# Patient Record
Sex: Female | Born: 1965 | Race: Black or African American | Hispanic: No | Marital: Married | Smoking: Never smoker
Health system: Southern US, Community
[De-identification: ages and names within clinical notes are randomized; demographics above are authoritative.]

## PROBLEM LIST (undated history)

## (undated) DIAGNOSIS — E669 Obesity, unspecified: Secondary | ICD-10-CM

## (undated) DIAGNOSIS — E119 Type 2 diabetes mellitus without complications: Secondary | ICD-10-CM

## (undated) DIAGNOSIS — E7800 Pure hypercholesterolemia, unspecified: Secondary | ICD-10-CM

## (undated) DIAGNOSIS — R600 Localized edema: Secondary | ICD-10-CM

## (undated) DIAGNOSIS — D509 Iron deficiency anemia, unspecified: Secondary | ICD-10-CM

## (undated) DIAGNOSIS — K2990 Gastroduodenitis, unspecified, without bleeding: Secondary | ICD-10-CM

## (undated) DIAGNOSIS — K219 Gastro-esophageal reflux disease without esophagitis: Secondary | ICD-10-CM

## (undated) DIAGNOSIS — R0600 Dyspnea, unspecified: Secondary | ICD-10-CM

## (undated) DIAGNOSIS — M25552 Pain in left hip: Secondary | ICD-10-CM

## (undated) DIAGNOSIS — M549 Dorsalgia, unspecified: Secondary | ICD-10-CM

## (undated) DIAGNOSIS — E785 Hyperlipidemia, unspecified: Secondary | ICD-10-CM

## (undated) DIAGNOSIS — E049 Nontoxic goiter, unspecified: Secondary | ICD-10-CM

## (undated) HISTORY — DX: Pure hypercholesterolemia, unspecified: E78.00

## (undated) HISTORY — DX: Dorsalgia, unspecified: M54.9

## (undated) HISTORY — DX: Gastroduodenitis, unspecified, without bleeding: K29.90

## (undated) HISTORY — DX: Nontoxic goiter, unspecified: E04.9

## (undated) HISTORY — PX: TUBAL LIGATION: SHX77

## (undated) HISTORY — DX: Gastro-esophageal reflux disease without esophagitis: K21.9

## (undated) HISTORY — DX: Dyspnea, unspecified: R06.00

## (undated) HISTORY — DX: Iron deficiency anemia, unspecified: D50.9

## (undated) HISTORY — DX: Obesity, unspecified: E66.9

## (undated) HISTORY — DX: Localized edema: R60.0

## (undated) HISTORY — DX: Pain in left hip: M25.552

## (undated) HISTORY — PX: ABDOMINAL HYSTERECTOMY: SHX81

## (undated) HISTORY — DX: Hyperlipidemia, unspecified: E78.5

---

## 1999-07-11 ENCOUNTER — Emergency Department (HOSPITAL_COMMUNITY): Admission: EM | Admit: 1999-07-11 | Discharge: 1999-07-11 | Payer: Self-pay | Admitting: *Deleted

## 1999-09-21 ENCOUNTER — Inpatient Hospital Stay (HOSPITAL_COMMUNITY): Admission: AD | Admit: 1999-09-21 | Discharge: 1999-09-21 | Payer: Self-pay | Admitting: Obstetrics

## 2000-10-05 ENCOUNTER — Encounter: Payer: Self-pay | Admitting: Family Medicine

## 2000-10-05 ENCOUNTER — Ambulatory Visit (HOSPITAL_COMMUNITY): Admission: RE | Admit: 2000-10-05 | Discharge: 2000-10-05 | Payer: Self-pay | Admitting: Family Medicine

## 2000-11-19 ENCOUNTER — Emergency Department (HOSPITAL_COMMUNITY): Admission: EM | Admit: 2000-11-19 | Discharge: 2000-11-19 | Payer: Self-pay

## 2001-03-22 ENCOUNTER — Inpatient Hospital Stay (HOSPITAL_COMMUNITY): Admission: AD | Admit: 2001-03-22 | Discharge: 2001-03-22 | Payer: Self-pay | Admitting: Obstetrics

## 2001-04-18 ENCOUNTER — Emergency Department (HOSPITAL_COMMUNITY): Admission: EM | Admit: 2001-04-18 | Discharge: 2001-04-18 | Payer: Self-pay | Admitting: Emergency Medicine

## 2001-04-18 ENCOUNTER — Encounter: Payer: Self-pay | Admitting: Emergency Medicine

## 2001-07-23 ENCOUNTER — Emergency Department (HOSPITAL_COMMUNITY): Admission: EM | Admit: 2001-07-23 | Discharge: 2001-07-23 | Payer: Self-pay | Admitting: Emergency Medicine

## 2004-09-07 ENCOUNTER — Other Ambulatory Visit: Admission: RE | Admit: 2004-09-07 | Discharge: 2004-09-07 | Payer: Self-pay | Admitting: Obstetrics and Gynecology

## 2005-04-05 ENCOUNTER — Emergency Department (HOSPITAL_COMMUNITY): Admission: EM | Admit: 2005-04-05 | Discharge: 2005-04-05 | Payer: Self-pay | Admitting: Family Medicine

## 2005-07-15 ENCOUNTER — Emergency Department (HOSPITAL_COMMUNITY): Admission: EM | Admit: 2005-07-15 | Discharge: 2005-07-15 | Payer: Self-pay | Admitting: Family Medicine

## 2005-11-25 ENCOUNTER — Other Ambulatory Visit: Admission: RE | Admit: 2005-11-25 | Discharge: 2005-11-25 | Payer: Self-pay | Admitting: Obstetrics and Gynecology

## 2008-02-23 ENCOUNTER — Ambulatory Visit (HOSPITAL_BASED_OUTPATIENT_CLINIC_OR_DEPARTMENT_OTHER): Admission: RE | Admit: 2008-02-23 | Discharge: 2008-02-23 | Payer: Self-pay | Admitting: Urology

## 2008-07-18 ENCOUNTER — Emergency Department (HOSPITAL_COMMUNITY): Admission: EM | Admit: 2008-07-18 | Discharge: 2008-07-18 | Payer: Self-pay | Admitting: Family Medicine

## 2008-11-14 ENCOUNTER — Encounter: Admission: RE | Admit: 2008-11-14 | Discharge: 2008-11-14 | Payer: Self-pay | Admitting: Internal Medicine

## 2009-04-25 ENCOUNTER — Emergency Department (HOSPITAL_COMMUNITY): Admission: EM | Admit: 2009-04-25 | Discharge: 2009-04-25 | Payer: Self-pay | Admitting: Emergency Medicine

## 2009-05-20 ENCOUNTER — Ambulatory Visit (HOSPITAL_COMMUNITY): Admission: RE | Admit: 2009-05-20 | Discharge: 2009-05-20 | Payer: Self-pay | Admitting: Obstetrics & Gynecology

## 2009-10-31 ENCOUNTER — Encounter (INDEPENDENT_AMBULATORY_CARE_PROVIDER_SITE_OTHER): Payer: Self-pay | Admitting: Obstetrics & Gynecology

## 2009-10-31 ENCOUNTER — Observation Stay (HOSPITAL_COMMUNITY): Admission: RE | Admit: 2009-10-31 | Discharge: 2009-11-01 | Payer: Self-pay | Admitting: Obstetrics & Gynecology

## 2009-10-31 HISTORY — PX: ABDOMINAL HYSTERECTOMY: SHX81

## 2010-01-30 ENCOUNTER — Encounter: Admission: RE | Admit: 2010-01-30 | Discharge: 2010-01-30 | Payer: Self-pay | Admitting: Family Medicine

## 2010-07-03 ENCOUNTER — Inpatient Hospital Stay (INDEPENDENT_AMBULATORY_CARE_PROVIDER_SITE_OTHER)
Admission: RE | Admit: 2010-07-03 | Discharge: 2010-07-03 | Disposition: A | Discharge status: Home / Self Care | Payer: BC Managed Care – PPO | Source: Ambulatory Visit | Attending: Family Medicine | Admitting: Family Medicine

## 2010-07-03 DIAGNOSIS — S335XXA Sprain of ligaments of lumbar spine, initial encounter: Secondary | ICD-10-CM

## 2010-07-19 LAB — CBC
HCT: 30.9 % — ABNORMAL LOW (ref 36.0–46.0)
Hemoglobin: 10.1 g/dL — ABNORMAL LOW (ref 12.0–15.0)
MCH: 25.9 pg — ABNORMAL LOW (ref 26.0–34.0)
MCHC: 32.6 g/dL (ref 30.0–36.0)
MCV: 79.5 fL (ref 78.0–100.0)
Platelets: 281 10*3/uL (ref 150–400)
RBC: 3.88 MIL/uL (ref 3.87–5.11)
RDW: 17.1 % — ABNORMAL HIGH (ref 11.5–15.5)
WBC: 11.5 10*3/uL — ABNORMAL HIGH (ref 4.0–10.5)

## 2010-07-19 LAB — TYPE AND SCREEN
ABO/RH(D): A POS
Antibody Screen: NEGATIVE

## 2010-07-19 LAB — ABO/RH: ABO/RH(D): A POS

## 2010-07-20 LAB — CBC
HCT: 33.1 % — ABNORMAL LOW (ref 36.0–46.0)
HCT: 35.1 % — ABNORMAL LOW (ref 36.0–46.0)
Hemoglobin: 10.7 g/dL — ABNORMAL LOW (ref 12.0–15.0)
Hemoglobin: 11.2 g/dL — ABNORMAL LOW (ref 12.0–15.0)
MCH: 25.6 pg — ABNORMAL LOW (ref 26.0–34.0)
MCHC: 31.9 g/dL (ref 30.0–36.0)
MCHC: 32.5 g/dL (ref 30.0–36.0)
MCV: 78.9 fL (ref 78.0–100.0)
MCV: 79.7 fL (ref 78.0–100.0)
Platelets: 273 10*3/uL (ref 150–400)
Platelets: 290 10*3/uL (ref 150–400)
RBC: 4.19 MIL/uL (ref 3.87–5.11)
RBC: 4.41 MIL/uL (ref 3.87–5.11)
RDW: 17.1 % — ABNORMAL HIGH (ref 11.5–15.5)
RDW: 18 % — ABNORMAL HIGH (ref 11.5–15.5)
WBC: 5.2 10*3/uL (ref 4.0–10.5)
WBC: 5.6 10*3/uL (ref 4.0–10.5)

## 2010-07-20 LAB — SURGICAL PCR SCREEN
MRSA, PCR: NEGATIVE
Staphylococcus aureus: POSITIVE — AB

## 2010-07-20 LAB — PREGNANCY, URINE
Preg Test, Ur: NEGATIVE
Preg Test, Ur: NEGATIVE

## 2010-08-13 LAB — POCT RAPID STREP A (OFFICE): Streptococcus, Group A Screen (Direct): NEGATIVE

## 2010-08-26 ENCOUNTER — Other Ambulatory Visit: Payer: Self-pay | Admitting: Family Medicine

## 2010-08-26 DIAGNOSIS — E041 Nontoxic single thyroid nodule: Secondary | ICD-10-CM

## 2010-08-28 ENCOUNTER — Ambulatory Visit
Admission: RE | Admit: 2010-08-28 | Discharge: 2010-08-28 | Disposition: A | Discharge status: Home / Self Care | Payer: BC Managed Care – PPO | Source: Ambulatory Visit | Attending: Family Medicine | Admitting: Family Medicine

## 2010-08-28 DIAGNOSIS — E041 Nontoxic single thyroid nodule: Secondary | ICD-10-CM

## 2010-09-15 NOTE — Op Note (Signed)
NAME:  Leah Gonzalez, Leah Gonzalez             ACCOUNT NO.:  [id number]   MEDICAL RECORD NO.:  [id number]          PATIENT TYPE:  AMB   LOCATION:  [facility]                         FACILITY:  [facility]   PHYSICIAN:  Lindaann Slough, M.D.  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   PROCEDURE:  Cystoscopy, meatal dilation, HOB.   SURGEON:  Danae Chen, M.D.   ANESTHESIA:  General.   INDICATIONS:  The patient is a 45 year old female who has been  complaining of pelvic pain, frequency, urgency, decreased force of  urinary stream.  She was treated with terazosin and oxybutynin and she  has continued to have the same symptoms.  She could not tolerate  urethral catheterization.  She is scheduled today for cystoscopy, HOD.   The procedure risks and benefits were discussed with the patient.  The  risks include but are not limited to hemorrhage, pelvic pain, dysuria,  bladder rupture.  She understands and would like to proceed.   PROCEDURE IN DETAIL:  The patient was identified by her wrist band and a  proper time-out was taken.  Under general anesthesia she was prepped and  draped and placed in the dorsal lithotomy position.  A panendoscope  could not be inserted in the bladder because of meatal stenosis.  The  urethra was then dilated up to #32 [place] then the panendoscope was  inserted without difficulty in the bladder.  There was no stone or tumor  in the bladder.  The ureteral orifices were in normal position and shape  with clear efflux.  Upon refilling of the bladder there was evidence of  submucosal hemorrhage.  The bladder was then distended for about 10  minutes and the bladder capacity was about 400 mL.  The bladder was  then  emptied and then the cystoscope removed.  Then 400 mg of Pyridium in 20  mL of 0.25% Marcaine were instilled in the bladder using #22 [place]  sounds.  The patient tolerated the procedure well and left the OR in  satisfactory condition to post anesthesia care unit.      Lindaann Slough, M.D.  Electronically Signed     MN/MEDQ  D:  02/23/2008  T:  02/23/2008  Job:  [id number]

## 2011-02-02 LAB — POCT PREGNANCY, URINE: Preg Test, Ur: NEGATIVE

## 2011-02-02 LAB — POCT HEMOGLOBIN-HEMACUE: Hemoglobin: 10.3 — ABNORMAL LOW

## 2011-02-19 ENCOUNTER — Other Ambulatory Visit: Payer: Self-pay | Admitting: Family Medicine

## 2011-02-19 DIAGNOSIS — E041 Nontoxic single thyroid nodule: Secondary | ICD-10-CM

## 2011-02-22 ENCOUNTER — Ambulatory Visit
Admission: RE | Admit: 2011-02-22 | Discharge: 2011-02-22 | Disposition: A | Discharge status: Home / Self Care | Payer: BC Managed Care – PPO | Source: Ambulatory Visit | Attending: Family Medicine | Admitting: Family Medicine

## 2011-02-22 DIAGNOSIS — E041 Nontoxic single thyroid nodule: Secondary | ICD-10-CM

## 2011-02-25 ENCOUNTER — Other Ambulatory Visit: Payer: Self-pay | Admitting: Family Medicine

## 2011-02-25 DIAGNOSIS — E041 Nontoxic single thyroid nodule: Secondary | ICD-10-CM

## 2011-03-08 ENCOUNTER — Other Ambulatory Visit: Payer: Self-pay | Admitting: Physician Assistant

## 2011-03-08 DIAGNOSIS — E041 Nontoxic single thyroid nodule: Secondary | ICD-10-CM

## 2011-03-10 ENCOUNTER — Other Ambulatory Visit (HOSPITAL_COMMUNITY)
Admission: RE | Admit: 2011-03-10 | Discharge: 2011-03-10 | Disposition: A | Discharge status: Home / Self Care | Payer: BC Managed Care – PPO | Source: Ambulatory Visit | Attending: Diagnostic Radiology | Admitting: Diagnostic Radiology

## 2011-03-10 ENCOUNTER — Ambulatory Visit
Admission: RE | Admit: 2011-03-10 | Discharge: 2011-03-10 | Disposition: A | Discharge status: Home / Self Care | Payer: BC Managed Care – PPO | Source: Ambulatory Visit | Attending: Family Medicine | Admitting: Family Medicine

## 2011-03-10 DIAGNOSIS — E049 Nontoxic goiter, unspecified: Secondary | ICD-10-CM | POA: Insufficient documentation

## 2012-01-26 ENCOUNTER — Encounter: Payer: Self-pay | Admitting: *Deleted

## 2012-01-26 ENCOUNTER — Encounter: Payer: BC Managed Care – PPO | Attending: Obstetrics & Gynecology | Admitting: *Deleted

## 2012-01-26 VITALS — Ht 60.0 in | Wt 203.3 lb

## 2012-01-26 DIAGNOSIS — E669 Obesity, unspecified: Secondary | ICD-10-CM | POA: Insufficient documentation

## 2012-01-26 DIAGNOSIS — Z713 Dietary counseling and surveillance: Secondary | ICD-10-CM | POA: Insufficient documentation

## 2012-01-26 NOTE — Patient Instructions (Addendum)
For anemia: choose more lean cut of red mets: sirloin, tenderloin, filet, instead of ribeye.  Look for cut that has least amount of marbling (fat) Choose 90/10 ground beef or ground [place] Choose red met 1-2 times/week; dried bean (pinto, kidney, northern, black eyed peas) 2-3 times a week Choose leafy green vegetables (spinach, turnip green, collard greens etc) 2-3 times Special K or other fortified cereal most days for breakfast Try orange or tangerine or clementine or any citrus fruit  Goals:  Eat 3 meals/day, Avoid meal skipping   Increase protein rich foods  Follow "Plate Method" for portion control  Limit carbohydrate1-2 servings/meal   Choose more whole grains, lean protein, low-fat dairy, and fruits/non-starchy vegetables.   Aim for increased physical activity (walk maybe 2 days/week) take stairs, park farther away, etc  Continue Limit sugar-sweetened beverages and concentrated sweets  Choose Smart Balance vegetable spread or Promise Spread Low fat popcorn, mini bag size Choose lean cuts of meat and low fat cooking methods (bake, broil, grill, NOT fry) Use olive oil or vegetable oil for cooking Choose vinaigrette dressing instead of creamy dressing more often   Guilt-free snack options: Pretzels, low fat popcorn, baked chips, sweet options from handout

## 2012-01-26 NOTE — Progress Notes (Signed)
  Medical Nutrition Therapy:  Appt start time: 1430 end time:  1530.   Assessment:  Primary concerns today: obesity. Also reveals she suffers from anemia  MEDICATIONS: none   DIETARY INTAKE:  Usual eating pattern includes 2-3 meals and 1-2 snacks per day.  Everyday foods include starches, fatty proteins, some fruits and vegetables.  Avoided foods include none.    24-hr recall:  B ( AM): hardee's harshbrowns; special k; may skip Snk ( AM): yogurt  L ( PM): chicken nuggets and mashed potatoes and applesauce; salad Snk ( PM): popcorns, cookie D ( PM): 2 bowls broccoli soup with chicken and onion; fried talapia with mashed potatoes and northern beans Snk ( PM): none Beverages: water or diet soda (tries to limit now); splenda in tea; 2% milk in cereal  Usual physical activity: sometimes walks  Estimated energy needs: 1400 calories 158 g carbohydrates 105 g protein 39 g fat  Progress Towards Goal(s):  In progress.   Nutritional Diagnosis:  [place]-3.3 Overweight/obesity As related to irregular meal pattern, fatty food choices, and limited physical activity.  As evidenced by BMI of 39.7.    Intervention:  Nutrition counseling provided.Leah Gonzalez is here for weight loss education.  She reports not knowing anything about healthy eating.  She typically skips breakfast, has a high fat lunch, and may have balanced dinner or may have popcorn or cereal for dinner.  Currently has no physical activity.  Discussed MyPlate for meal planning: complex carbohydrates; lean proteins; non-starchy vegetables.  Suggested using fruits as snacks.  Discussed low-fat food preparation methods, increasing fiber, and increasing iron-rich foods for her anemia.  Discussed healthy snack ideas and encouraged her to eat 3 meals a day.  Also suggested increasing physical activity.  Handouts given during visit include:  Reading food labels  Guilt-free sweets  My pocket portion guide  Monitoring/Evaluation:  Dietary  intake, exercise, and body weight in 1 month(s).

## 2012-02-14 ENCOUNTER — Telehealth: Payer: Self-pay | Admitting: *Deleted

## 2012-02-28 ENCOUNTER — Encounter: Payer: BC Managed Care – PPO | Attending: Obstetrics & Gynecology | Admitting: *Deleted

## 2012-02-28 VITALS — Ht 60.0 in | Wt 197.8 lb

## 2012-02-28 DIAGNOSIS — Z713 Dietary counseling and surveillance: Secondary | ICD-10-CM | POA: Insufficient documentation

## 2012-02-28 DIAGNOSIS — E669 Obesity, unspecified: Secondary | ICD-10-CM | POA: Insufficient documentation

## 2012-02-28 DIAGNOSIS — R635 Abnormal weight gain: Secondary | ICD-10-CM

## 2012-02-28 NOTE — Progress Notes (Signed)
  Medical Nutrition Therapy:  Appt start time: 1630 end time:  1700.   Assessment:  Primary concerns today: obesity follow up.   MEDICATIONS: see list   DIETARY INTAKE:  Usual eating pattern includes 3 meals and 1-2 snacks per day.  Everyday foods include lean meats, fruits, vegetables, some starches.  Avoided foods include excessive fats.    24-hr recall:  B ( AM): tex[place] toast, ham, and cheese or cereal with milk Snk ( AM): none usually  L ( PM): grilled cheese and applesauce Snk ( PM): small portion pringles D ( PM): baked, grilled meat with vegetables and starch Snk ( PM): non usually Beverages: water  Usual physical activity: one time a week  Estimated energy needs: 1400 calories 158 g carbohydrates 105 g protein 39 g fat  Progress Towards Goal(s):  Some progress.   Nutritional Diagnosis:  [place]-3.3 Overweight/obesity As related to irregular meal pattern, fatty food choices, and limited physical activity. As evidenced by BMI of 38.6      Intervention:  Nutrition counseling provided.  Leah Gonzalez made many lifestyle changes and has lost about 6 pounds since her last visit!  She has cut back her portions, cut out fried foods (she only bakes, broils, or grills now)  She has limited her fast food intake, cut out sodas, drinks more water, has increased her fruits and vegetables consumption, she limits her starches, follow MyPlate recommendations, and limits her fat to 10 g/serving.  She has followed virtually all dietary recommendations related to weight management.  She also increased iron-rich foods like spinach and lean red meats like sirloin.  She eats more dried beans and fortified cereals.  She has not increased her physically activity.  She likes to walk on her treadmill for about an hour, but hasn't done that much.  Suggested trying to walk for 20 minutes, instead of an hour. Try to walk 20 minutes 3-4 times a week.  Leah Gonzalez has imposed some pretty strict dietary rules on herself:  no bread AND potatoes in the same meal; no pasta, etc.  Encouraged her to reject black and white thinking and to relax those rules.  Taught her to honor her hunger and satiety cues and to eat pasta if that's what she wants, but to stop when full (intuitive eating).  Suggested rating hunger and satiety on a scale.   Monitoring/Evaluation:  Dietary intake, exercise, and body weight in 3 month(s).

## 2012-05-30 ENCOUNTER — Ambulatory Visit: Payer: BC Managed Care – PPO | Admitting: *Deleted

## 2012-06-11 ENCOUNTER — Emergency Department (HOSPITAL_COMMUNITY)
Admission: EM | Admit: 2012-06-11 | Discharge: 2012-06-11 | Disposition: A | Discharge status: Home / Self Care | Payer: BC Managed Care – PPO | Source: Home / Self Care

## 2012-06-11 ENCOUNTER — Encounter (HOSPITAL_COMMUNITY): Payer: Self-pay | Admitting: *Deleted

## 2012-06-11 ENCOUNTER — Emergency Department (INDEPENDENT_AMBULATORY_CARE_PROVIDER_SITE_OTHER): Payer: BC Managed Care – PPO

## 2012-06-11 DIAGNOSIS — M25569 Pain in unspecified knee: Secondary | ICD-10-CM

## 2012-06-11 MED ORDER — HYDROCODONE-ACETAMINOPHEN 5-325 MG PO TABS: 1.0000 | ORAL_TABLET | Freq: Four times a day (QID) | ORAL | Status: DC | PRN
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
## 2012-06-13 NOTE — ED Provider Notes (Signed)
Medical screening examination/treatment/procedure(s) were performed by resident physician or non-physician practitioner and as supervising physician I was immediately available for consultation/collaboration.   Lauralyn Shadowens DOUGLAS MD.   Benjimen Kelley D Nakeya Adinolfi, MD 06/13/12 1109 

## 2012-07-05 ENCOUNTER — Encounter: Payer: BC Managed Care – PPO | Attending: Family Medicine | Admitting: *Deleted

## 2012-07-05 VITALS — Ht 60.0 in | Wt 202.4 lb

## 2012-07-05 DIAGNOSIS — E669 Obesity, unspecified: Secondary | ICD-10-CM | POA: Insufficient documentation

## 2012-07-05 DIAGNOSIS — Z713 Dietary counseling and surveillance: Secondary | ICD-10-CM | POA: Insufficient documentation

## 2012-07-05 NOTE — Progress Notes (Signed)
  Medical Nutrition Therapy:  Appt start time: 1600 end time:  1630.   Assessment:  Primary concerns today: Leah Gonzalez is here for a follow up appointment for obesity education.  She did gain about 4 pounds in 4 months and she is upset by this.  A dietary recall reveals high sodium content, which could contribute to water retention and thus excess weight.  She also has not been as physically active since she's out of work for her knee.     DIETARY INTAKE:  Usual eating pattern includes 3 meals and 2 snacks per day.  Everyday foods include processed foods.  Avoided foods include none.    24-hr recall: been out of work for 1 month B ( AM): 3 strips of bacon, egg beater sometimes with spinach and grits with butter with water  Snk ( AM): tangerines  Sometimes onion rings  L ( PM): oodles of noodles or grilled cheese and tomato soup;  Snk ( PM): key lime pie ice cream D ( PM): grilled cheese and tomato soup, spinach pizza with olives and chicken Snk ( PM): maybe orange Beverages: water  Usual physical activity: none currently  Estimated energy needs: 1600 calories 180 g carbohydrates 120 g protein 44 g fat  Progress Towards Goal(s):  In progress.   Nutritional Diagnosis:  [place]-3.3 Overweight/obesity As related to history of irregular meal pattern, fatty food choices, and limited physical activity. As evidenced by BMI>30     Intervention:  Nutrition counseling provided.  Discussed sodium content of foods and its effect on water retention.  Recommended 2300 mg sodium/day.  Suggested Detra start reading food labels and aim to keep sodium content low.  Since she is at home currently, she has more time to prepare meals.  Suggested making more foods from fresh ingredients at home, rather than consuming so much processed foods. Suggested limiting bacon and canned soups and oodles of noodles.  Encouraged body movement.  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  She also agreed that she could do upper body exercises while seated.  Handouts given during visit include:  Reading food labels  Monitoring/Evaluation:  Dietary intake, exercise, and body weight in 1 month(s).

## 2012-08-14 ENCOUNTER — Ambulatory Visit: Payer: BC Managed Care – PPO | Admitting: *Deleted

## 2012-11-24 ENCOUNTER — Emergency Department (HOSPITAL_COMMUNITY)
Admission: EM | Admit: 2012-11-24 | Discharge: 2012-11-24 | Disposition: A | Discharge status: Home / Self Care | Payer: BC Managed Care – PPO | Attending: Emergency Medicine | Admitting: Emergency Medicine

## 2012-11-24 ENCOUNTER — Encounter (HOSPITAL_COMMUNITY): Payer: Self-pay | Admitting: *Deleted

## 2012-11-24 DIAGNOSIS — S139XXA Sprain of joints and ligaments of unspecified parts of neck, initial encounter: Secondary | ICD-10-CM | POA: Insufficient documentation

## 2012-11-24 DIAGNOSIS — Y9389 Activity, other specified: Secondary | ICD-10-CM | POA: Insufficient documentation

## 2012-11-24 DIAGNOSIS — Z79899 Other long term (current) drug therapy: Secondary | ICD-10-CM | POA: Insufficient documentation

## 2012-11-24 DIAGNOSIS — S161XXA Strain of muscle, fascia and tendon at neck level, initial encounter: Secondary | ICD-10-CM

## 2012-11-24 DIAGNOSIS — E669 Obesity, unspecified: Secondary | ICD-10-CM | POA: Insufficient documentation

## 2012-11-24 DIAGNOSIS — Y92410 Unspecified street and highway as the place of occurrence of the external cause: Secondary | ICD-10-CM | POA: Insufficient documentation

## 2012-11-24 MED ORDER — CYCLOBENZAPRINE HCL 10 MG PO TABS: 10.0000 mg | ORAL_TABLET | Freq: Two times a day (BID) | ORAL | Status: DC | PRN

## 2012-11-24 MED ORDER — OXYCODONE-ACETAMINOPHEN 5-325 MG PO TABS: 1.0000 | ORAL_TABLET | ORAL | Status: DC | PRN

## 2012-11-24 MED ORDER — IBUPROFEN 800 MG PO TABS: 800.0000 mg | ORAL_TABLET | Freq: Three times a day (TID) | ORAL | Status: DC
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
## 2013-03-16 ENCOUNTER — Emergency Department (HOSPITAL_COMMUNITY)
Admission: EM | Admit: 2013-03-16 | Discharge: 2013-03-16 | Disposition: A | Discharge status: Home / Self Care | Payer: BC Managed Care – PPO | Attending: Emergency Medicine | Admitting: Emergency Medicine

## 2013-03-16 ENCOUNTER — Emergency Department (HOSPITAL_COMMUNITY): Payer: BC Managed Care – PPO

## 2013-03-16 DIAGNOSIS — R6883 Chills (without fever): Secondary | ICD-10-CM | POA: Insufficient documentation

## 2013-03-16 DIAGNOSIS — R05 Cough: Secondary | ICD-10-CM

## 2013-03-16 DIAGNOSIS — Z791 Long term (current) use of non-steroidal anti-inflammatories (NSAID): Secondary | ICD-10-CM | POA: Insufficient documentation

## 2013-03-16 DIAGNOSIS — R51 Headache: Secondary | ICD-10-CM | POA: Insufficient documentation

## 2013-03-16 DIAGNOSIS — R091 Pleurisy: Secondary | ICD-10-CM | POA: Insufficient documentation

## 2013-03-16 DIAGNOSIS — R059 Cough, unspecified: Secondary | ICD-10-CM

## 2013-03-16 DIAGNOSIS — Z79899 Other long term (current) drug therapy: Secondary | ICD-10-CM | POA: Insufficient documentation

## 2013-03-16 DIAGNOSIS — IMO0002 Reserved for concepts with insufficient information to code with codable children: Secondary | ICD-10-CM | POA: Insufficient documentation

## 2013-03-16 DIAGNOSIS — Z91040 Latex allergy status: Secondary | ICD-10-CM | POA: Insufficient documentation

## 2013-03-16 DIAGNOSIS — R0602 Shortness of breath: Secondary | ICD-10-CM | POA: Insufficient documentation

## 2013-03-16 LAB — BASIC METABOLIC PANEL
BUN: 18 mg/dL (ref 6–23)
CO2: 29 mEq/L (ref 19–32)
Calcium: 9.2 mg/dL (ref 8.4–10.5)
Chloride: 99 mEq/L (ref 96–112)
Creatinine, Ser: 0.68 mg/dL (ref 0.50–1.10)
GFR calc Af Amer: >90 mL/min (ref >90)
GFR calc non Af Amer: >90 mL/min (ref >90)
Glucose, Bld: 89 mg/dL (ref 70–99)
Potassium: 3.7 mEq/L (ref 3.5–5.1)
Sodium: 135 mEq/L (ref 135–145)

## 2013-03-16 LAB — CBC WITH DIFFERENTIAL/PLATELET
Basophils Absolute: 0 10*3/uL (ref 0.0–0.1)
Basophils Relative: 0 % (ref 0–1)
Eosinophils Absolute: 0.1 10*3/uL (ref 0.0–0.7)
Eosinophils Relative: 1 % (ref 0–5)
HCT: 36.1 % (ref 36.0–46.0)
Hemoglobin: 11.7 g/dL — ABNORMAL LOW (ref 12.0–15.0)
Lymphocytes Relative: 51 % — ABNORMAL HIGH (ref 12–46)
Lymphs Abs: 5.6 10*3/uL — ABNORMAL HIGH (ref 0.7–4.0)
MCH: 26.3 pg (ref 26.0–34.0)
MCHC: 32.4 g/dL (ref 30.0–36.0)
MCV: 81.1 fL (ref 78.0–100.0)
Monocytes Absolute: 0.7 10*3/uL (ref 0.1–1.0)
Monocytes Relative: 7 % (ref 3–12)
Neutro Abs: 4.6 10*3/uL (ref 1.7–7.7)
Neutrophils Relative %: 42 % — ABNORMAL LOW (ref 43–77)
Platelets: 314 10*3/uL (ref 150–400)
RBC: 4.45 MIL/uL (ref 3.87–5.11)
RDW: 14.8 % (ref 11.5–15.5)
WBC: 11 10*3/uL — ABNORMAL HIGH (ref 4.0–10.5)

## 2013-03-16 LAB — POCT I-STAT TROPONIN I: Troponin i, poc: 0.02 ng/mL (ref 0.00–0.08)

## 2013-03-16 LAB — PRO B NATRIURETIC PEPTIDE: Pro B Natriuretic peptide (BNP): 66.3 pg/mL (ref 0–125)

## 2013-03-16 LAB — D-DIMER, QUANTITATIVE: D-Dimer, Quant: 0.28 ug/mL-FEU (ref 0.00–0.48)

## 2013-03-16 MED ORDER — NAPROXEN 500 MG PO TABS: 500.0000 mg | ORAL_TABLET | Freq: Two times a day (BID) | ORAL | Status: DC

## 2013-03-16 MED ORDER — HYDROCODONE-ACETAMINOPHEN 5-325 MG PO TABS: 1.0000 | ORAL_TABLET | ORAL | Status: DC | PRN

## 2013-03-16 MED ORDER — SODIUM CHLORIDE 0.9 % IV SOLN
INTRAVENOUS | Status: DC
  Administered 2013-03-16: 18:00:00 via INTRAVENOUS

## 2013-03-16 NOTE — ED Notes (Signed)
MD at bedside. 

## 2013-03-16 NOTE — ED Provider Notes (Signed)
CSN: [id number]     Arrival date & time 03/16/13  1630 History  First MD Initiated Contact with Patient 03/16/13 1633     Chief Complaint  Patient presents with  . Cough  . Chest Pain   HPI Comments: Symptoms initially started out as a uri type illness.  She had nasal congestion and felt feverish.  Her doctor started her on abx empricially.  Symptoms have not improved.  She continues to have a peristent cough and has pain in the chest and shoulders with coughing.  She does feel short of breath.  No leg swelling.  No history of DVT or PE.  She saw her doctor today and was told to come to the ED for blood tests and xrays.  Patient is a 47 y.o. female presenting with cough and chest pain. The history is provided by the patient.  Cough Cough characteristics:  Non-productive Onset quality:  Gradual Duration:  3 weeks Progression since onset: not improving. Relieved by:  Nothing Worsened by:  Deep breathing Associated symptoms: chest pain, chills and headaches   Associated symptoms: no diaphoresis, no rash, no sore throat (her voice his hoarse) and no wheezing   Chest Pain Pain quality: sharp   Timing:  Intermittent Worsened by:  Deep breathing Associated symptoms: cough and headache   Associated symptoms: no diaphoresis     No past medical history on file. No past surgical history on file. No family history on file. History  Substance Use Topics  . Smoking status: Not on file  . Smokeless tobacco: Not on file  . Alcohol Use: Not on file   OB History   No data available     Review of Systems  Constitutional: Positive for chills. Negative for diaphoresis.  HENT: Negative for sore throat (her voice his hoarse).   Respiratory: Positive for cough. Negative for wheezing.   Cardiovascular: Positive for chest pain.  Skin: Negative for rash.  Neurological: Positive for headaches.  All other systems reviewed and are negative.    Allergies  Latex  Home Medications   Current  Outpatient Rx  Name  Route  Sig  Dispense  Refill  . docusate sodium (COLACE) 100 MG capsule   Oral   Take 100 mg by mouth 2 (two) times daily.         . Multiple Vitamin (MULTIVITAMIN WITH MINERALS) TABS tablet   Oral   Take 1 tablet by mouth daily.         . phentermine (ADIPEX-P) 37.5 MG tablet   Oral   Take 37.5 mg by mouth daily before breakfast.         . predniSONE (STERAPRED UNI-PAK) 10 MG tablet   Oral   Take 1-2 tablets by mouth 4 (four) times daily.         . benzonatate (TESSALON) 100 MG capsule   Oral   Take 100 mg by mouth 3 (three) times daily as needed for cough.         Marland Kitchen HYDROcodone-acetaminophen (NORCO/VICODIN) 5-325 MG per tablet   Oral   Take 1-2 tablets by mouth every 4 (four) hours as needed.   16 tablet   0   . naproxen (NAPROSYN) 500 MG tablet   Oral   Take 1 tablet (500 mg total) by mouth 2 (two) times daily.   30 tablet   0    BP 124/79  Pulse 84  Temp(Src) 98.9 F (37.2 C) (Oral)  Resp 16  SpO2 100% Physical Exam  Nursing note and vitals reviewed. Constitutional: She appears well-developed and well-nourished. No distress.  HENT:  Head: Normocephalic and atraumatic.  Right Ear: External ear normal.  Left Ear: External ear normal.  Eyes: Conjunctivae are normal. Right eye exhibits no discharge. Left eye exhibits no discharge. No scleral icterus.  Neck: Neck supple. No tracheal deviation present.  ? Crepitus supraclavicular region  Cardiovascular: Normal rate, regular rhythm and intact distal pulses.   Pulmonary/Chest: Effort normal and breath sounds normal. No stridor. No respiratory distress. She has no wheezes. She has no rales.  Abdominal: Soft. Bowel sounds are normal. She exhibits no distension. There is no tenderness. There is no rebound and no guarding.  Musculoskeletal: She exhibits no edema and no tenderness.  Neurological: She is alert. She has normal strength. No sensory deficit. Cranial nerve deficit:  no gross  defecits noted. She exhibits normal muscle tone. She displays no seizure activity. Coordination normal.  Skin: Skin is warm and dry. No rash noted.  Psychiatric: She has a normal mood and affect.    ED Course  Procedures (including critical care time) Labs Review Labs Reviewed  CBC WITH DIFFERENTIAL - Abnormal; Notable for the following:    WBC 11.0 (*)    Hemoglobin 11.7 (*)    Neutrophils Relative % 42 (*)    Lymphocytes Relative 51 (*)    Lymphs Abs 5.6 (*)    All other components within normal limits  BASIC METABOLIC PANEL  D-DIMER, QUANTITATIVE  PRO B NATRIURETIC PEPTIDE  POCT I-STAT TROPONIN I   Imaging Review Dg Chest 2 View  03/16/2013   CLINICAL DATA:  Cough.  Chest pain.  EXAM: CHEST  2 VIEW  COMPARISON:  None.  FINDINGS: The heart size and mediastinal contours are within normal limits. Both lungs are clear. The visualized skeletal structures are unremarkable.  IMPRESSION: No active cardiopulmonary disease.   Electronically Signed   By: Maisie Fus  Register   On: 03/16/2013 17:10    EKG Interpretation     Ventricular Rate:  80 PR Interval:  132 QRS Duration: 85 QT Interval:  364 QTC Calculation: 420 R Axis:   56 Text Interpretation:  Sinus rhythm Probable left atrial enlargement No old tracing to compare            MDM   1. Pleurisy   2. Cough    No pna, or ptx.  Doubt PE.  Possible pleurisy and persistent bronchitis.  At this time there does not appear to be any evidence of an acute emergency medical condition and the patient appears stable for discharge with appropriate outpatient follow up.     Celene Kras, MD 03/16/13 [phone]

## 2013-03-16 NOTE — ED Notes (Signed)
Pt c/o dry cough, fever, headache, soreness to shoulders and chest pain for the past 3 weeks. Pt has been to PMD for same. Pt has taken abx for her symptoms, but states she is not getting better. States her doctor wanted her to come to ED for further testing. Pt states she only has chest pain with deep breathing and coughing. Pt states chest pain is sharp and is in mid chest and L anterior chest. Pt reports that her voice is hoarse. States she has slight nausea. Pt with no acute distress. Skin warm, dry.

## 2013-11-22 ENCOUNTER — Other Ambulatory Visit: Payer: Self-pay | Admitting: Family Medicine

## 2013-11-22 ENCOUNTER — Ambulatory Visit
Admission: RE | Admit: 2013-11-22 | Discharge: 2013-11-22 | Disposition: A | Discharge status: Home / Self Care | Payer: BC Managed Care – PPO | Source: Ambulatory Visit | Attending: Family Medicine | Admitting: Family Medicine

## 2013-11-22 DIAGNOSIS — M533 Sacrococcygeal disorders, not elsewhere classified: Secondary | ICD-10-CM

## 2013-11-26 ENCOUNTER — Other Ambulatory Visit: Payer: Self-pay | Admitting: Family Medicine

## 2013-11-26 DIAGNOSIS — E049 Nontoxic goiter, unspecified: Secondary | ICD-10-CM

## 2013-11-29 ENCOUNTER — Ambulatory Visit
Admission: RE | Admit: 2013-11-29 | Discharge: 2013-11-29 | Disposition: A | Discharge status: Home / Self Care | Payer: BC Managed Care – PPO | Source: Ambulatory Visit | Attending: Family Medicine | Admitting: Family Medicine

## 2013-11-29 DIAGNOSIS — E049 Nontoxic goiter, unspecified: Secondary | ICD-10-CM

## 2014-05-28 ENCOUNTER — Other Ambulatory Visit: Payer: Self-pay | Admitting: Family Medicine

## 2014-05-28 ENCOUNTER — Ambulatory Visit
Admission: RE | Admit: 2014-05-28 | Discharge: 2014-05-28 | Disposition: A | Discharge status: Home / Self Care | Payer: BC Managed Care – PPO | Source: Ambulatory Visit | Attending: Family Medicine | Admitting: Family Medicine

## 2014-05-28 DIAGNOSIS — R0600 Dyspnea, unspecified: Secondary | ICD-10-CM

## 2015-09-15 NOTE — Telephone Encounter (Signed)
returning 2 voicemail from ms Ivins.  left message to call back

## 2016-09-06 ENCOUNTER — Encounter (HOSPITAL_COMMUNITY): Payer: Self-pay | Admitting: *Deleted

## 2016-09-06 ENCOUNTER — Ambulatory Visit (HOSPITAL_COMMUNITY)
Admission: EM | Admit: 2016-09-06 | Discharge: 2016-09-06 | Disposition: A | Discharge status: Home / Self Care | Payer: BC Managed Care – PPO | Attending: Internal Medicine | Admitting: Internal Medicine

## 2016-09-06 DIAGNOSIS — M25561 Pain in right knee: Secondary | ICD-10-CM | POA: Diagnosis not present

## 2016-09-06 DIAGNOSIS — G8929 Other chronic pain: Secondary | ICD-10-CM | POA: Diagnosis not present

## 2016-09-06 MED ORDER — MELOXICAM 15 MG PO TABS: 15.0000 mg | ORAL_TABLET | Freq: Every day | ORAL | 2 refills | Status: DC

## 2016-09-06 NOTE — ED Triage Notes (Signed)
BOTH KNEES  PAINFULL  AND  SWOLLEN  R  IS    WORSE  THAN   L     OLD  INJURY   NOTHING    RECENT  PAIN  IS  WORSE   AT  NIGHT   AND  ON WEIGHT  BEARING

## 2016-09-06 NOTE — Discharge Instructions (Signed)
For your knee pain, we have placed your knee in a knee sleeve, I prescribed meloxicam, take one tablet daily. Do not take any ibuprofen, Aleve, or other over-the-counter NSAIDs with this medication. You may take over-the-counter Tylenol with it. I recommend rest, ice, compression of the joint through the wearing of a knee sleeve, and elevation. Provided a work note, if your symptoms persist or fail to resolve, follow up with your primary care doctor or an orthopedist.
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
## 2017-12-18 ENCOUNTER — Emergency Department (HOSPITAL_COMMUNITY)
Admission: EM | Admit: 2017-12-18 | Discharge: 2017-12-18 | Disposition: A | Discharge status: Home / Self Care | Payer: BC Managed Care – PPO | Attending: Emergency Medicine | Admitting: Emergency Medicine

## 2017-12-18 ENCOUNTER — Emergency Department (HOSPITAL_COMMUNITY): Payer: BC Managed Care – PPO

## 2017-12-18 ENCOUNTER — Other Ambulatory Visit: Payer: Self-pay

## 2017-12-18 ENCOUNTER — Encounter (HOSPITAL_COMMUNITY): Payer: Self-pay | Admitting: Emergency Medicine

## 2017-12-18 DIAGNOSIS — R0602 Shortness of breath: Secondary | ICD-10-CM | POA: Diagnosis present

## 2017-12-18 DIAGNOSIS — Z79899 Other long term (current) drug therapy: Secondary | ICD-10-CM | POA: Insufficient documentation

## 2017-12-18 LAB — I-STAT TROPONIN, ED
Troponin i, poc: 0 ng/mL (ref 0.00–0.08)
Troponin i, poc: 0.01 ng/mL (ref 0.00–0.08)

## 2017-12-18 LAB — BASIC METABOLIC PANEL
Anion gap: 10 (ref 5–15)
BUN: 12 mg/dL (ref 6–20)
CO2: 24 mmol/L (ref 22–32)
Calcium: 9.2 mg/dL (ref 8.9–10.3)
Chloride: 107 mmol/L (ref 98–111)
Creatinine, Ser: 0.77 mg/dL (ref 0.44–1.00)
GFR calc Af Amer: >60 mL/min (ref >60)
GFR calc non Af Amer: >60 mL/min (ref >60)
Glucose, Bld: 97 mg/dL (ref 70–99)
Potassium: 3.6 mmol/L (ref 3.5–5.1)
Sodium: 141 mmol/L (ref 135–145)

## 2017-12-18 LAB — CBC
HCT: 36.6 % (ref 36.0–46.0)
Hemoglobin: 11.3 g/dL — ABNORMAL LOW (ref 12.0–15.0)
MCH: 25.6 pg — ABNORMAL LOW (ref 26.0–34.0)
MCHC: 30.9 g/dL (ref 30.0–36.0)
MCV: 83 fL (ref 78.0–100.0)
Platelets: 301 10*3/uL (ref 150–400)
RBC: 4.41 MIL/uL (ref 3.87–5.11)
RDW: 15.2 % (ref 11.5–15.5)
WBC: 8.4 10*3/uL (ref 4.0–10.5)

## 2017-12-18 LAB — D-DIMER, QUANTITATIVE: D-Dimer, Quant: 0.29 ug/mL-FEU (ref 0.00–0.50)

## 2017-12-18 LAB — TSH: TSH: 1.946 u[IU]/mL (ref 0.350–4.500)

## 2017-12-18 MED ORDER — ACETAMINOPHEN 325 MG PO TABS
650.0000 mg | ORAL_TABLET | Freq: Once | ORAL | Status: AC
  Administered 2017-12-18: 650 mg via ORAL
  Filled 2017-12-18: qty 2

## 2017-12-18 NOTE — Discharge Instructions (Signed)
Please follow-up with your primary care doctor within 1 week of discharge.  If worsening shortness of breath, chest pain, lower leg swelling please come back to the emergency department immediately.

## 2017-12-18 NOTE — ED Provider Notes (Signed)
[facility] Provider Note   CSN: [id number] Arrival date & time: 12/18/17  1539     History   Chief Complaint Chief Complaint  Patient presents with  . Shortness of Breath    HPI Lucretia Fleeta EmmerM Aldous is a 52 y.o. female.  Patient is a 52 year old female presenting with 1 hour of rapid onset shortness of breath.  Patient states that she was sitting down to eat when she began to feel short of breath and began sweating.  Patient reports at that time she was having associated nausea and lightheadedness as well.  Patient had weakness and felt her throat was tightening.  Patient also has had headache.  She denies chest pain but does report patient states that shortness of breath is somewhat improved but is still present.  Patient also says that throat tightening is also improved now.  Patient denies any loss of consciousness or any recent injuries.  Patient denies any recent travel.  Patient moves around a lot during the day and is not sedentary.  Patient denies any hormonal birth control methods.  Patient has no prior history of any blood clots, but mother had a DVT once.  Patient has history of enlarged thyroid but is not on any medications.  Patient's only medications are for acid reflux and a "water pill" but otherwise no change in medications recently.  Patient's husband said a similar episode has happened in the past but has not been this severe .     Past Medical History:  Diagnosis Date  . Obesity     There are no active problems to display for this patient.   Past Surgical History:  Procedure Laterality Date  . ABDOMINAL HYSTERECTOMY    . TUBAL LIGATION       OB History   None      Home Medications    Prior to Admission medications   Medication Sig Start Date End Date Taking? Authorizing Provider  celecoxib (CELEBREX) 200 MG capsule Take 200 mg by mouth daily as needed. 10/13/17   [provider]  cyclobenzaprine  (FLEXERIL) 10 MG tablet Take 10 mg by mouth daily as needed. 12/15/17   [provider]  DEXILANT 60 MG capsule Take 60 mg by mouth daily. 11/29/17   [provider]  docusate sodium (COLACE) 100 MG capsule Take 100 mg by mouth 2 (two) times daily.    [provider]  meloxicam (MOBIC) 15 MG tablet Take 1 tablet (15 mg total) by mouth daily. 09/06/16   Dorena BodoKennard, Lawrence, NP  Multiple Vitamin (MULTIVITAMIN WITH MINERALS) TABS tablet Take 1 tablet by mouth daily.    [provider]  triamterene-hydrochlorothiazide (MAXZIDE-25) 37.5-25 MG tablet Take 1 tablet by mouth daily. 12/06/17   [provider]    Family History Family History  Problem Relation Age of Onset  . Asthma Mother   . Hypertension Father     Social History Social History   Tobacco Use  . Smoking status: Never Smoker  Substance Use Topics  . Alcohol use: No  . Drug use: No     Allergies   Latex   Review of Systems Review of Systems  Constitutional: Positive for diaphoresis. Negative for activity change.  Respiratory: Positive for shortness of breath.   Cardiovascular: Positive for palpitations. Negative for chest pain and leg swelling.  Gastrointestinal: Positive for diarrhea and nausea. Negative for vomiting.  Genitourinary: Negative for difficulty urinating.  Musculoskeletal: Negative for arthralgias and myalgias.  Neurological: Positive for light-headedness and headaches.     Physical Exam Updated Vital Signs BP 128/71 (BP Location: Left Arm)   Pulse 81   Temp 98.1 F (36.7 C) (Oral)   Resp 18   SpO2 100%   Physical Exam  Constitutional: She does not appear ill. No distress.  HENT:  Head: Normocephalic.  Mouth/Throat: Oropharynx is clear and moist.  Eyes: Pupils are equal, round, and reactive to light. EOM are normal.  Neck: Normal range of motion. Neck supple. No thyromegaly present.  Cardiovascular: Normal rate, regular rhythm, normal heart sounds and  intact distal pulses. Exam reveals no friction rub.  No murmur heard. Pulmonary/Chest: Effort normal and breath sounds normal. No accessory muscle usage. No tachypnea. No respiratory distress. She has no wheezes. She has no rhonchi. She has no rales. She exhibits no mass, no tenderness and no edema.  Abdominal: Soft. Bowel sounds are normal. She exhibits no mass. There is tenderness.  Diffuse tenderness throughout abdomen  Musculoskeletal: Normal range of motion.       Right lower leg: Normal. She exhibits no tenderness and no edema.       Left lower leg: Normal. She exhibits no tenderness and no edema.  4/5 muscle strength in lower extremities bilaterally Negative Homan's sign, no calf tenderness bilaterally  Neurological: She is alert.  Skin: Skin is warm. No erythema.  Psychiatric: She has a normal mood and affect.  Vitals reviewed.    ED Treatments / Results  Labs (all labs ordered are listed, but only abnormal results are displayed) Labs Reviewed  CBC - Abnormal; Notable for the following components:      Result Value   Hemoglobin 11.3 (*)    MCH 25.6 (*)    All other components within normal limits  D-DIMER, QUANTITATIVE (NOT AT [facility])  BASIC METABOLIC PANEL  TSH  I-STAT TROPONIN, ED  I-STAT TROPONIN, ED    EKG EKG Interpretation  Date/Time:  Sunday December 18 2017 15:47:53 EDT Ventricular Rate:  94 PR Interval:  126 QRS Duration: 92 QT Interval:  356 QTC Calculation: 445 R Axis:   34 Text Interpretation:  Normal sinus rhythm Normal ECG Confirmed by Blane Ohara [phone]) on 12/18/2017 4:02:17 PM   Radiology Dg Chest 2 View  Result Date: 12/18/2017 CLINICAL DATA:  Mid chest pain, shortness of breath since this afternoon. EXAM: CHEST - 2 VIEW COMPARISON:  Chest radiograph April 09, 2017 FINDINGS: Cardiomediastinal silhouette is normal. No pleural effusions or focal consolidations. Trachea projects midline and there is no pneumothorax. Soft tissue planes and  included osseous structures are non-suspicious. Large body habitus. IMPRESSION: Negative. Electronically Signed   By: Awilda Metro M.D.   On: 12/18/2017 17:52    Procedures Procedures (including critical care time)  Medications Ordered in ED Medications  acetaminophen (TYLENOL) tablet 650 mg (650 mg Oral Given 12/18/17 1921)     Initial Impression / Assessment and Plan / ED Course  I have reviewed the triage vital signs and the nursing notes.  Pertinent labs & imaging results that were available during my care of the patient were reviewed by me and considered in my medical decision making (see chart for details).     Patient is a 52 year old female presenting with 1 hour of shortness of breath.  Shortness of breath was rapid onset with associated sweating, nausea, lightheadedness, weakness, throat tightening, and headache.  Patient denies any recent changes.  Denies any recent travel or hormonal birth control.  Patient is not sedentary.  Patient is has history of enlarged thyroid but is no on any medications.  Return for possible PE so will obtain d-dimer to rule this out.  Patient with no prior history of any blood clots, however family members with DVT.  Will obtain troponin and EKG as well to rule out atypical ACS.  Will obtain CBC to rule out anemia.  Will obtain CMP to rule out electrolyte abnormalities.  Will obtain TSH to rule out possible thyroid disease.  Also obtain chest x-ray to rule out structural causes.  Lungs clear to auscultation bilaterally and moving good air.  O2 saturations in the mid to high 90s to 100% reassuring.  6:04PM: D-dimer wnl at 0.29, BMP wnl, initial istat troponin neg, CBC showing Hgb of 11.3 (however patient notes she is chronically anemic). Will plan to recheck troponin in 3 hours. If negative patient can likely be discharged.   9:04PM: second istat troponin negative. TSH wnl. Unclear etiology at this time. Possibly anxiety related as patient has had  similar symptoms in the past. Given that istat troponin was neg x2 will plan for discharge. Patient given strict return precautions and instructed to follow up with PCP within 1 week.   Discussed plan with Dr. Jodi MourningZavitz who independently examined patient and agrees with plan.  Final Clinical Impressions(s) / ED Diagnoses   Final diagnoses:  Shortness of breath    ED Discharge Orders    None       Oralia Manisbraham, Tyeler Goedken, DO 12/18/17 2129    Blane OharaZavitz, Joshua, MD 12/19/17 [phone]

## 2017-12-18 NOTE — ED Triage Notes (Addendum)
Pt arrives tachypnic and anxious, reports sudden onset of shortness of breath 1 hour ago. Denies chest pain, states tightness in her throat. Denies allergies to food or drink. Pt lung sounds are clear. No swelling to lips or tongue. Pt states her throat feels tight and swollen. Hx of thyroid disease. No facial edema.

## 2018-01-12 ENCOUNTER — Other Ambulatory Visit: Payer: Self-pay | Admitting: Family Medicine

## 2018-01-12 ENCOUNTER — Ambulatory Visit
Admission: RE | Admit: 2018-01-12 | Discharge: 2018-01-12 | Disposition: A | Discharge status: Home / Self Care | Payer: BC Managed Care – PPO | Source: Ambulatory Visit | Attending: Family Medicine | Admitting: Family Medicine

## 2018-01-12 DIAGNOSIS — M25552 Pain in left hip: Secondary | ICD-10-CM

## 2018-01-12 DIAGNOSIS — M545 Low back pain: Secondary | ICD-10-CM

## 2018-11-10 ENCOUNTER — Other Ambulatory Visit: Payer: Self-pay | Admitting: Family Medicine

## 2018-11-10 ENCOUNTER — Ambulatory Visit
Admission: RE | Admit: 2018-11-10 | Discharge: 2018-11-10 | Disposition: A | Discharge status: Home / Self Care | Payer: BC Managed Care – PPO | Source: Ambulatory Visit | Attending: Family Medicine | Admitting: Family Medicine

## 2018-11-10 DIAGNOSIS — R1032 Left lower quadrant pain: Secondary | ICD-10-CM

## 2018-11-10 MED ORDER — IOPAMIDOL (ISOVUE-300) INJECTION 61%
100.0000 mL | Freq: Once | INTRAVENOUS | Status: AC | PRN
  Administered 2018-11-10: 12:00:00 100 mL via INTRAVENOUS

## 2018-11-28 ENCOUNTER — Ambulatory Visit
Admission: RE | Admit: 2018-11-28 | Discharge: 2018-11-28 | Disposition: A | Discharge status: Home / Self Care | Payer: BC Managed Care – PPO | Source: Ambulatory Visit | Attending: Family Medicine | Admitting: Family Medicine

## 2018-11-28 ENCOUNTER — Other Ambulatory Visit: Payer: Self-pay

## 2018-11-28 ENCOUNTER — Other Ambulatory Visit: Payer: Self-pay | Admitting: Family Medicine

## 2018-11-28 DIAGNOSIS — R9389 Abnormal findings on diagnostic imaging of other specified body structures: Secondary | ICD-10-CM

## 2019-04-07 IMAGING — CR DG LUMBAR SPINE 2-3V
3 series · 3 of 3 positions shown · non-contrast
Comparison: None.

CLINICAL DATA: Low back pain for the past 6 months. No known
trauma. Evaluate for DDD.

EXAM:
LUMBAR SPINE - 2-3 VIEW

[w lumbar spine ap]
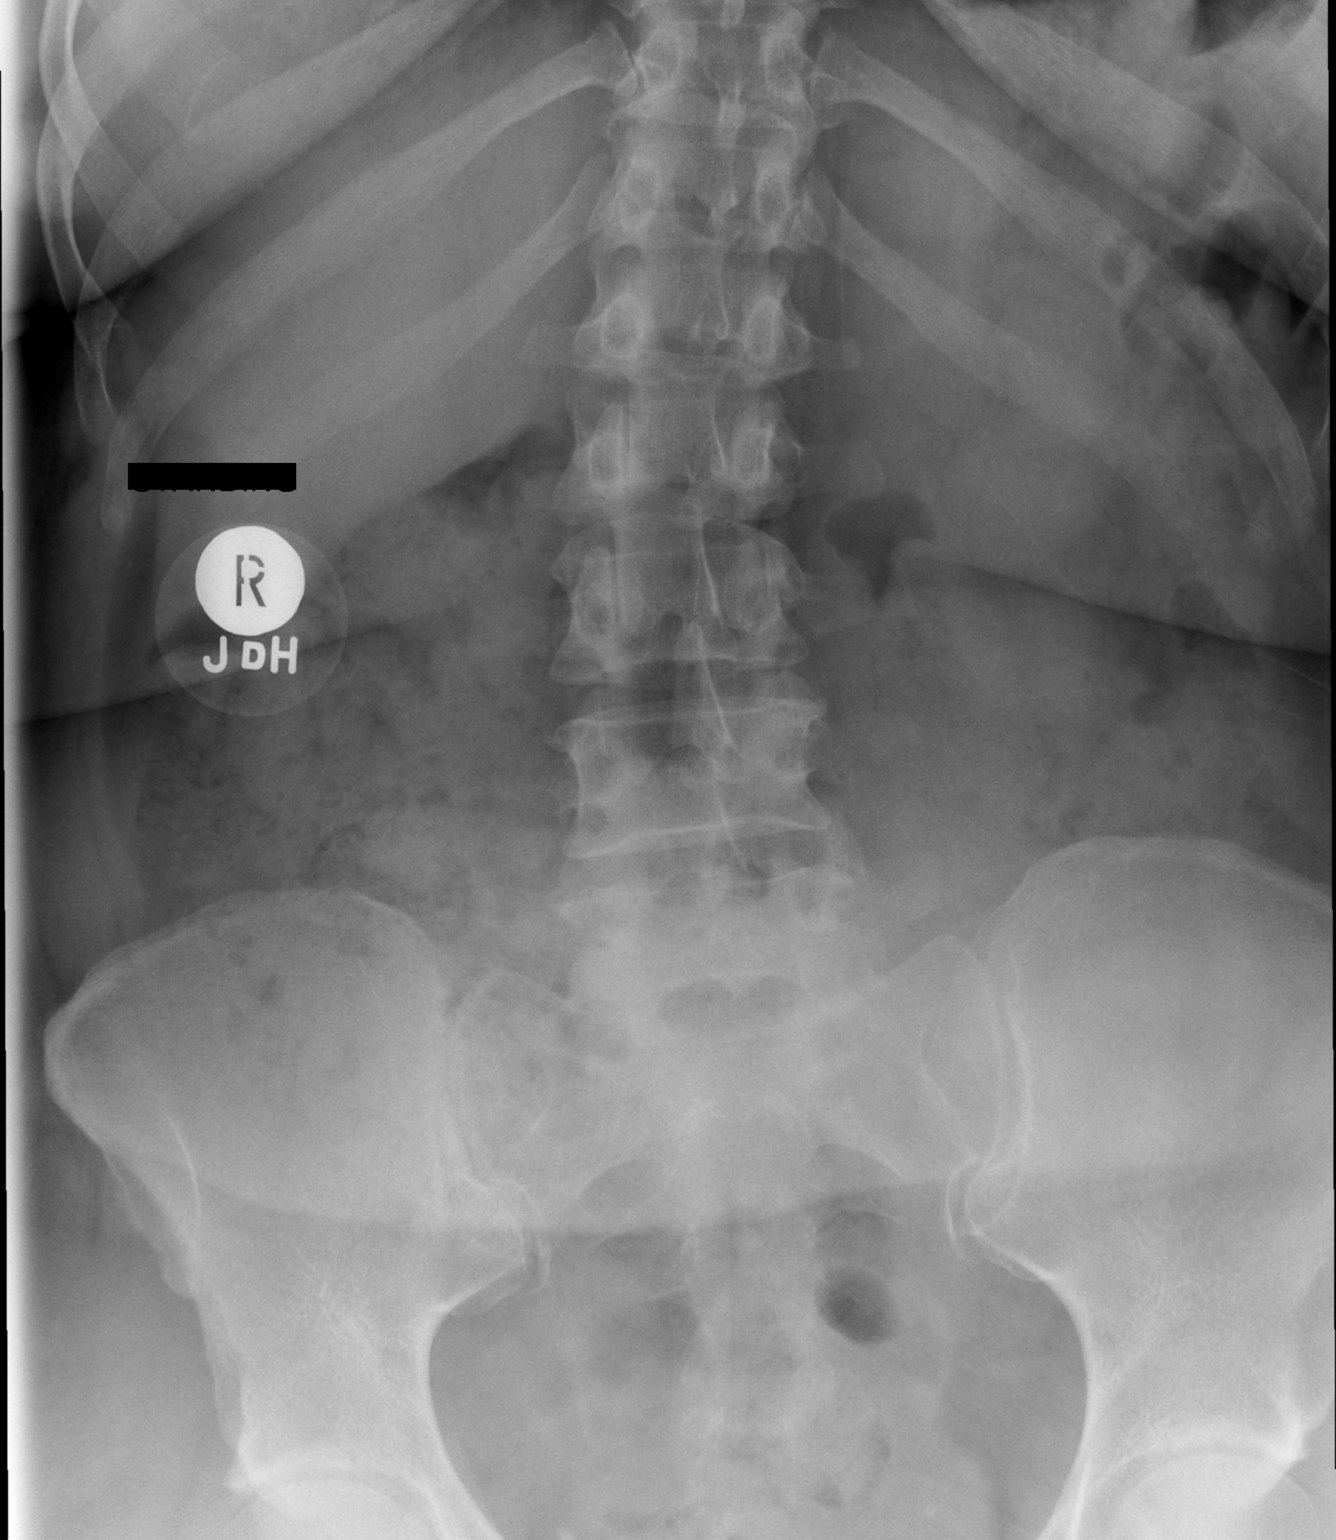

[w lumbar spine lat]
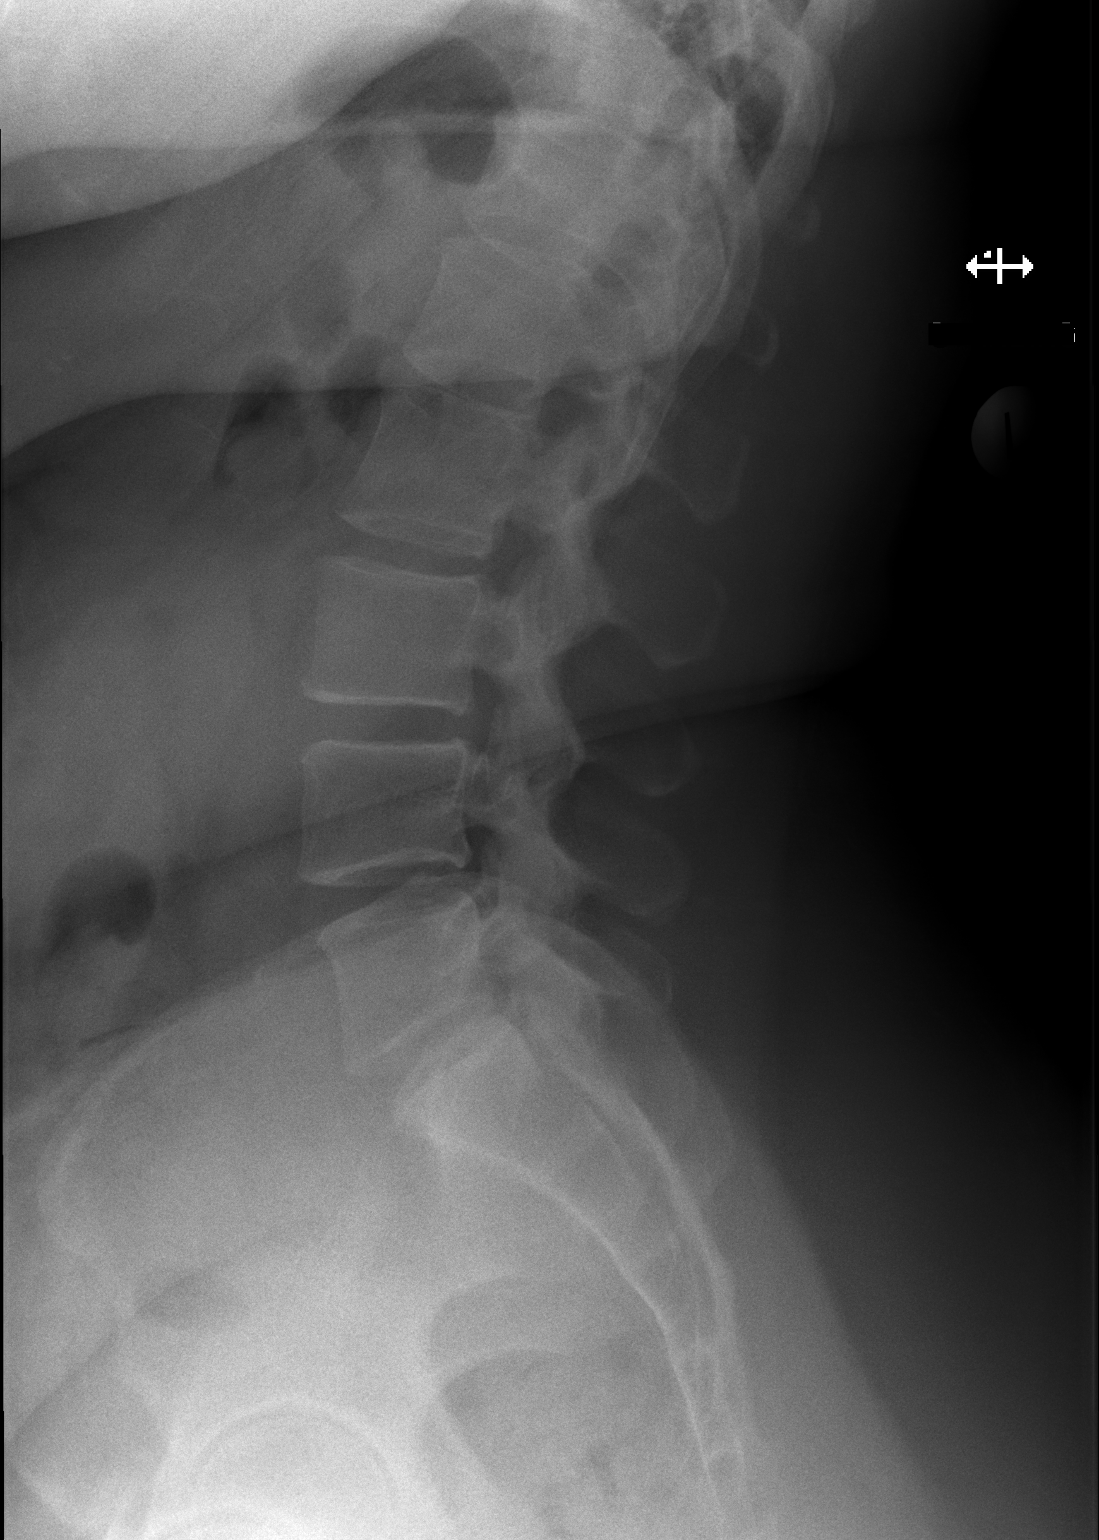

[w lumbar l-5 s-1 spot]
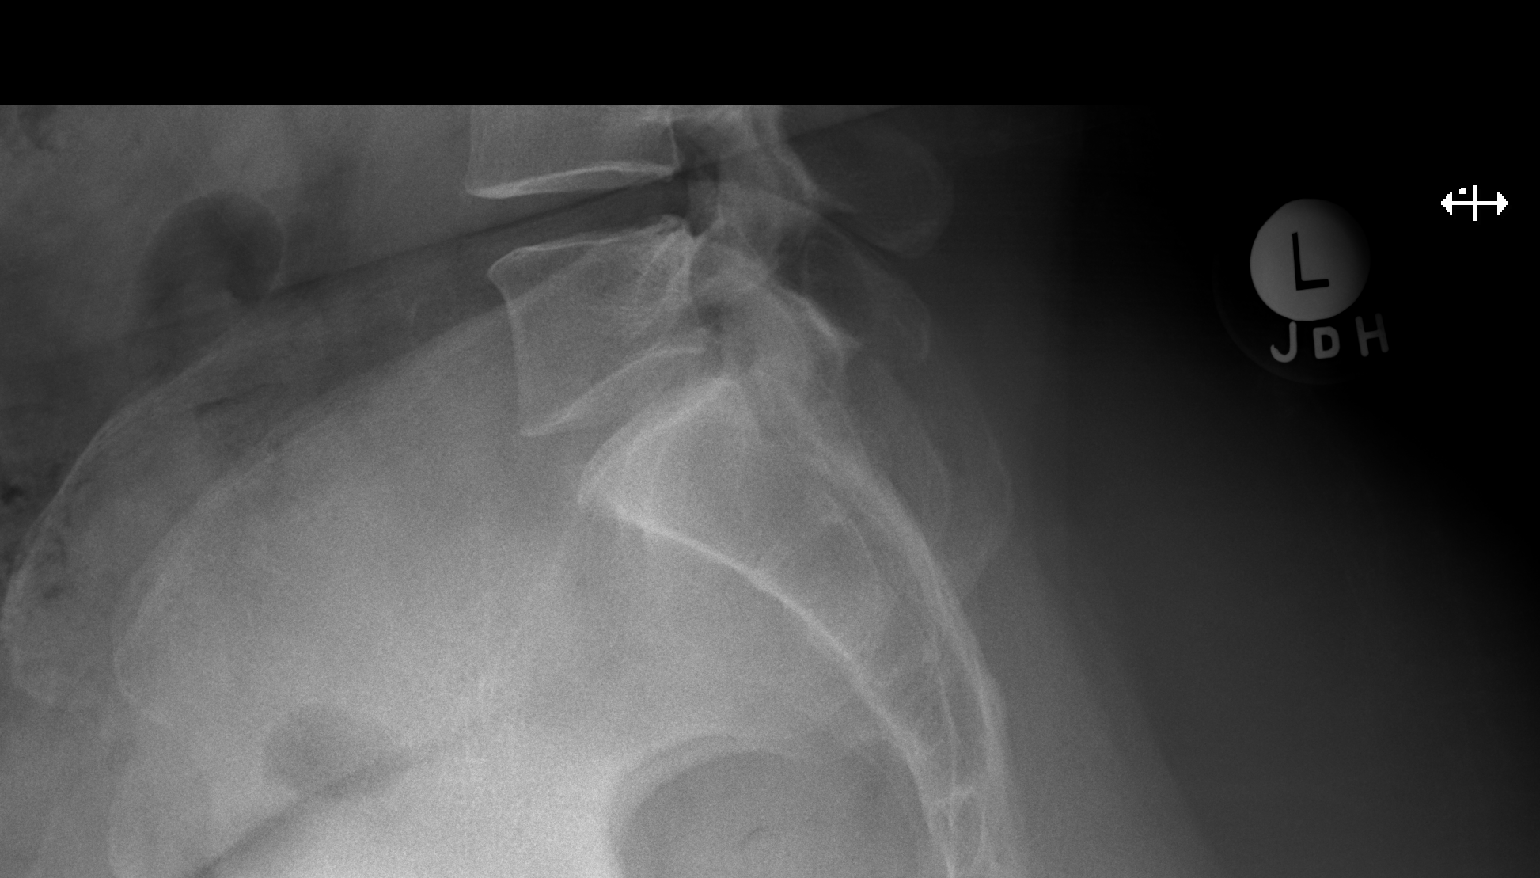

[3 of 3 positions shown; findings below may reference images not displayed]

FINDINGS: There are 5 non rib-bearing lumbar type vertebral bodies.

Mild scoliotic curvature of the thoracolumbar spine with dominant
caudal component convex to the right measuring approximately 11
degrees (as measured from the superior endplate of T12 to the
inferior endplate of L4). No anterolisthesis or retrolisthesis.

Lumbar vertebral body heights appear preserved given obliquity.

Mild multilevel lumbar spine DDD, worse at L3-L4 with disc space
height loss and endplate sclerosis.

Limited visualization the bilateral SI joints is normal.

Large colonic stool burden without evidence of enteric obstruction.
IMPRESSION: 1. Mild multilevel lumbar spine DDD.
2. Mild scoliotic curvature of the thoracolumbar spine, potentially
positional.

## 2019-06-30 ENCOUNTER — Ambulatory Visit: Payer: BC Managed Care – PPO | Attending: Internal Medicine

## 2019-07-14 ENCOUNTER — Ambulatory Visit: Payer: BC Managed Care – PPO | Attending: Internal Medicine

## 2019-07-14 DIAGNOSIS — Z23 Encounter for immunization: Secondary | ICD-10-CM

## 2019-07-14 NOTE — Progress Notes (Signed)
   Covid-19 Vaccination Clinic  Name:  Leah Gonzalez    MRN: [id number] DOB: 04/17/1966  07/14/2019  Ms. Rallis was observed post Covid-19 immunization for 15 minutes without incident. She was provided with Vaccine Information Sheet and instruction to access the V-Safe system.   Ms. Hogston was instructed to call 911 with any severe reactions post vaccine: Marland Kitchen Difficulty breathing  . Swelling of face and throat  . A fast heartbeat  . A bad rash all over body  . Dizziness and weakness   Immunizations Administered    Name Date Dose VIS Date Route   Pfizer COVID-19 Vaccine 07/14/2019 12:21 PM 0.3 mL 04/13/2019 Intramuscular   Manufacturer: [employer], [employer]   Lot: [id number]   NDC: [address]-7

## 2019-08-06 ENCOUNTER — Ambulatory Visit: Payer: BC Managed Care – PPO

## 2019-08-07 ENCOUNTER — Ambulatory Visit: Payer: BC Managed Care – PPO

## 2019-08-07 ENCOUNTER — Ambulatory Visit: Payer: BC Managed Care – PPO | Attending: Internal Medicine

## 2019-08-07 DIAGNOSIS — Z23 Encounter for immunization: Secondary | ICD-10-CM

## 2019-08-07 NOTE — Progress Notes (Signed)
   Covid-19 Vaccination Clinic  Name:  CAPRIA CARTAYA    MRN: [id number] DOB: April 25, 1966  08/07/2019  Ms. Beggs was observed post Covid-19 immunization for 15 minutes without incident. She was provided with Vaccine Information Sheet and instruction to access the V-Safe system.   Ms. Schiller was instructed to call 911 with any severe reactions post vaccine: Marland Kitchen Difficulty breathing  . Swelling of face and throat  . A fast heartbeat  . A bad rash all over body  . Dizziness and weakness   Immunizations Administered    Name Date Dose VIS Date Route   Pfizer COVID-19 Vaccine 08/07/2019  8:35 AM 0.3 mL 04/13/2019 Intramuscular   Manufacturer: [employer], [employer]   Lot: [id number]   NDC: [address]-5

## 2019-11-08 ENCOUNTER — Other Ambulatory Visit: Payer: Self-pay

## 2019-11-08 ENCOUNTER — Encounter (INDEPENDENT_AMBULATORY_CARE_PROVIDER_SITE_OTHER): Payer: Self-pay | Admitting: Family Medicine

## 2019-11-08 ENCOUNTER — Ambulatory Visit (INDEPENDENT_AMBULATORY_CARE_PROVIDER_SITE_OTHER): Payer: BC Managed Care – PPO | Admitting: Family Medicine

## 2019-11-08 VITALS — BP 107/72 | HR 78 | Temp 97.6°F | Ht 60.0 in | Wt 227.0 lb

## 2019-11-08 DIAGNOSIS — R5383 Other fatigue: Secondary | ICD-10-CM | POA: Diagnosis not present

## 2019-11-08 DIAGNOSIS — Z0289 Encounter for other administrative examinations: Secondary | ICD-10-CM

## 2019-11-08 DIAGNOSIS — F3289 Other specified depressive episodes: Secondary | ICD-10-CM

## 2019-11-08 DIAGNOSIS — Z6841 Body Mass Index (BMI) 40.0 and over, adult: Secondary | ICD-10-CM

## 2019-11-08 DIAGNOSIS — R600 Localized edema: Secondary | ICD-10-CM | POA: Diagnosis not present

## 2019-11-08 DIAGNOSIS — D508 Other iron deficiency anemias: Secondary | ICD-10-CM

## 2019-11-08 DIAGNOSIS — R0602 Shortness of breath: Secondary | ICD-10-CM | POA: Diagnosis not present

## 2019-11-08 DIAGNOSIS — Z9189 Other specified personal risk factors, not elsewhere classified: Secondary | ICD-10-CM

## 2019-11-08 NOTE — Progress Notes (Signed)
ekg 

## 2019-11-09 LAB — CBC WITH DIFFERENTIAL/PLATELET
Basophils Absolute: 0 10*3/uL (ref 0.0–0.2)
Basos: 0 %
EOS (ABSOLUTE): 0.1 10*3/uL (ref 0.0–0.4)
Eos: 2 %
Hematocrit: 39 % (ref 34.0–46.6)
Hemoglobin: 12.2 g/dL (ref 11.1–15.9)
Immature Grans (Abs): 0 10*3/uL (ref 0.0–0.1)
Immature Granulocytes: 0 %
Lymphocytes Absolute: 2.6 10*3/uL (ref 0.7–3.1)
Lymphs: 39 %
MCH: 25.4 pg — ABNORMAL LOW (ref 26.6–33.0)
MCHC: 31.3 g/dL — ABNORMAL LOW (ref 31.5–35.7)
MCV: 81 fL (ref 79–97)
Monocytes Absolute: 0.4 10*3/uL (ref 0.1–0.9)
Monocytes: 6 %
Neutrophils Absolute: 3.6 10*3/uL (ref 1.4–7.0)
Neutrophils: 53 %
Platelets: 326 10*3/uL (ref 150–450)
RBC: 4.8 x10E6/uL (ref 3.77–5.28)
RDW: 14.9 % (ref 11.7–15.4)
WBC: 6.8 10*3/uL (ref 3.4–10.8)

## 2019-11-09 LAB — T3: T3, Total: 125 ng/dL (ref 71–180)

## 2019-11-09 LAB — LIPID PANEL WITH LDL/HDL RATIO
Cholesterol, Total: 242 mg/dL — ABNORMAL HIGH (ref 100–199)
HDL: 46 mg/dL (ref >39)
LDL Chol Calc (NIH): 175 mg/dL — ABNORMAL HIGH (ref 0–99)
LDL/HDL Ratio: 3.8 ratio — ABNORMAL HIGH (ref 0.0–3.2)
Triglycerides: 118 mg/dL (ref 0–149)
VLDL Cholesterol Cal: 21 mg/dL (ref 5–40)

## 2019-11-09 LAB — COMPREHENSIVE METABOLIC PANEL
ALT: 23 IU/L (ref 0–32)
AST: 21 IU/L (ref 0–40)
Albumin/Globulin Ratio: 1.2 (ref 1.2–2.2)
Albumin: 4.3 g/dL (ref 3.8–4.9)
Alkaline Phosphatase: 92 IU/L (ref 48–121)
BUN/Creatinine Ratio: 18 (ref 9–23)
BUN: 12 mg/dL (ref 6–24)
Bilirubin Total: <0.2 mg/dL (ref 0.0–1.2)
CO2: 26 mmol/L (ref 20–29)
Calcium: 9.4 mg/dL (ref 8.7–10.2)
Chloride: 101 mmol/L (ref 96–106)
Creatinine, Ser: 0.67 mg/dL (ref 0.57–1.00)
GFR calc Af Amer: 117 mL/min/{1.73_m2} (ref >59)
GFR calc non Af Amer: 101 mL/min/{1.73_m2} (ref >59)
Globulin, Total: 3.6 g/dL (ref 1.5–4.5)
Glucose: 102 mg/dL — ABNORMAL HIGH (ref 65–99)
Potassium: 4.6 mmol/L (ref 3.5–5.2)
Sodium: 141 mmol/L (ref 134–144)
Total Protein: 7.9 g/dL (ref 6.0–8.5)

## 2019-11-09 LAB — FOLATE: Folate: 19.3 ng/mL (ref >3.0)

## 2019-11-09 LAB — TSH: TSH: 1.76 u[IU]/mL (ref 0.450–4.500)

## 2019-11-09 LAB — HEMOGLOBIN A1C
Est. average glucose Bld gHb Est-mCnc: 140 mg/dL
Hgb A1c MFr Bld: 6.5 % — ABNORMAL HIGH (ref 4.8–5.6)

## 2019-11-09 LAB — VITAMIN D 25 HYDROXY (VIT D DEFICIENCY, FRACTURES): Vit D, 25-Hydroxy: 27.3 ng/mL — ABNORMAL LOW (ref 30.0–100.0)

## 2019-11-09 LAB — INSULIN, RANDOM: INSULIN: 12.3 u[IU]/mL (ref 2.6–24.9)

## 2019-11-09 LAB — VITAMIN B12: Vitamin B-12: 623 pg/mL (ref 232–1245)

## 2019-11-09 LAB — T4, FREE: Free T4: 1.06 ng/dL (ref 0.82–1.77)

## 2019-11-13 ENCOUNTER — Encounter (INDEPENDENT_AMBULATORY_CARE_PROVIDER_SITE_OTHER): Payer: Self-pay | Admitting: Family Medicine

## 2019-11-13 NOTE — Telephone Encounter (Signed)
Please advise 

## 2019-11-13 NOTE — Progress Notes (Signed)
Chief Complaint:   OBESITY Leah Gonzalez (MR# [id number]) is a 54 y.o. female who presents for evaluation and treatment of obesity and related comorbidities. Current BMI is Body mass index is 44.33 kg/m. Leah Gonzalez has been struggling with her weight for many years and has been unsuccessful in either losing weight, maintaining weight loss, or reaching her healthy weight goal.  Leah Gonzalez is currently in the action stage of change and ready to dedicate time achieving and maintaining a healthier weight. Leah Gonzalez is interested in becoming our patient and working on intensive lifestyle modifications including (but not limited to) diet and exercise for weight loss.  Leah Gonzalez's habits were reviewed today and are as follows: Her family eats meals together, her desired weight loss is 52 lbs, she started gaining weight after marriage, her heaviest weight ever was 230 pounds, she is a picky eater and doesn't like to eat healthier foods, she snacks frequently in the evenings, she skips meals frequently, she is frequently drinking liquids with calories, she frequently makes poor food choices and she struggles with emotional eating.  Depression Screen Leah Gonzalez's Food and Mood (modified PHQ-9) score was 4.  Depression screen [facility] 2/9 11/08/2019  Decreased Interest 0  Down, Depressed, Hopeless 0  PHQ - 2 Score 0  Altered sleeping 1  Tired, decreased energy 1  Change in appetite 0  Feeling bad or failure about yourself  0  Trouble concentrating 2  Moving slowly or fidgety/restless 0  Suicidal thoughts 0  PHQ-9 Score 4  Difficult doing work/chores Not difficult at all   Subjective:   1. Other fatigue Leah Gonzalez admits to daytime somnolence and admits to waking up still tired. Patent has a history of symptoms of daytime fatigue and morning headache. Leah Gonzalez generally gets 6 or 7 hours of sleep per night, and states that she has generally restful sleep. Snoring is not present. Apneic episodes are not  present. Epworth Sleepiness Score is 17.  2. Shortness of breath on exertion Leah Gonzalez notes increasing shortness of breath with exercising and seems to be worsening over time with weight gain. She notes getting out of breath sooner with activity than she used to. This has not gotten worse recently. Leah Gonzalez denies shortness of breath at rest or orthopnea.  3. Other iron deficiency anemia Leah Gonzalez has a history of low hemoglobin in Epic. She notes fatigue and denies palpitations.  4. Bilateral lower extremity edema Leah Gonzalez is stable on Maxzide, and she is ready to work on improving with diet and exercise.  5. Other depression with emotional eating Leah Gonzalez notes some emotional eating behaviors, and she would benefit from an appointment with Dr. Dewaine Gonzalez, our Bariatric Psychologist.  6. At high risk for fluid overload Leah Gonzalez is at a higher than average risk for fluid retention due to lower extremity edema. Reviewed: no chest pain on exertion, no dyspnea at rest, and no swelling of ankles.  Assessment/Plan:   1. Other fatigue Leah Gonzalez does feel that her weight is causing her energy to be lower than it should be. Fatigue may be related to obesity, depression or many other causes. Labs will be ordered, and in the meanwhile, Leah Gonzalez will focus on self care including making healthy food choices, increasing physical activity and focusing on stress reduction.  - EKG 12-Lead - CBC with Differential/Platelet - Comprehensive metabolic panel - Hemoglobin A1c - Insulin, random - VITAMIN D 25 Hydroxy (Vit-D Deficiency, Fractures) - Vitamin B12 - Folate - T3 - T4, free - TSH  2. Shortness  of breath on exertion Leah Gonzalez does feel that she gets out of breath more easily that she used to when she exercises. Leah Gonzalez's shortness of breath appears to be obesity related and exercise induced. She has agreed to work on weight loss and gradually increase exercise to treat her exercise induced shortness of breath.  Will continue to monitor closely.  - Lipid Panel With LDL/HDL Ratio  3. Other iron deficiency anemia We will check labs today. Leah Gonzalez will follow up as directed. Orders and follow up as documented in patient record.  - CBC with Differential/Platelet - Vitamin B12 - Folate  4. Bilateral lower extremity edema We will check labs today. Leah Gonzalez will start her Category 3 plan, and will follow up as directed.  - CBC with Differential/Platelet - Comprehensive metabolic panel  5. Other depression with emotional eating Behavior modification techniques were discussed today to help Leah Gonzalez deal with her emotional/non-hunger eating behaviors. We will refer to Dr. Dewaine Gonzalez, our Bariatric Psychologist for evaluation. Orders and follow up as documented in patient record.   6. At high risk for fluid overload Leah Gonzalez was given approximately 30 minutes of fluid retention prevention counseling today. She is 54 y.o. female and has risk factors for fluid retention including obesity. We discussed intensive lifestyle modifications today with an emphasis on specific weight loss instructions, proper nutrition and exercise strategies.   Repetitive spaced learning was employed today to elicit superior memory formation and behavioral change.  7. Class 3 severe obesity with serious comorbidity and body mass index (BMI) of 40.0 to 44.9 in adult, unspecified obesity type (HCC) Leah Gonzalez is currently in the action stage of change and her goal is to continue with weight loss efforts. I recommend Leah Gonzalez begin the structured treatment plan as follows:  She has agreed to the Category 2 Plan + 100 calories.  Exercise goals: No exercise has been prescribed for now, while we concentrate on nutritional changes.  Behavioral modification strategies: decreasing sodium intake and meal planning and cooking strategies.  She was informed of the importance of frequent follow-up visits to maximize her success with intensive  lifestyle modifications for her multiple health conditions. She was informed we would discuss her lab results at her next visit unless there is a critical issue that needs to be addressed sooner. Leah Gonzalez agreed to keep her next visit at the agreed upon time to discuss these results.  Objective:   Blood pressure 107/72, pulse 78, temperature 97.6 F (36.4 C), temperature source Oral, height 5' (1.524 m), weight 227 lb (103 kg), SpO2 99 %. Body mass index is 44.33 kg/m.  EKG: Normal sinus rhythm, rate 81 BPM.  Indirect Calorimeter completed today shows a VO2 of 252 and a REE of 1753.  Her calculated basal metabolic rate is 1950 thus her basal metabolic rate is better than expected.  General: Cooperative, alert, well developed, in no acute distress. HEENT: Conjunctivae and lids unremarkable. Cardiovascular: Regular rhythm.  Lungs: Normal work of breathing. Neurologic: No focal deficits.   Lab Results  Component Value Date   CREATININE 0.67 11/08/2019   BUN 12 11/08/2019   NA 141 11/08/2019   K 4.6 11/08/2019   CL 101 11/08/2019   CO2 26 11/08/2019   Lab Results  Component Value Date   ALT 23 11/08/2019   AST 21 11/08/2019   ALKPHOS 92 11/08/2019   BILITOT <0.2 11/08/2019   Lab Results  Component Value Date   HGBA1C 6.5 (H) 11/08/2019   Lab Results  Component Value Date  INSULIN 12.3 11/08/2019   Lab Results  Component Value Date   TSH 1.760 11/08/2019   Lab Results  Component Value Date   CHOL 242 (H) 11/08/2019   HDL 46 11/08/2019   LDLCALC 175 (H) 11/08/2019   TRIG 118 11/08/2019   Lab Results  Component Value Date   WBC 6.8 11/08/2019   HGB 12.2 11/08/2019   HCT 39.0 11/08/2019   MCV 81 11/08/2019   PLT 326 11/08/2019   No results found for: IRON, TIBC, FERRITIN  Attestation Statements:   Reviewed by clinician on day of visit: allergies, medications, problem list, medical history, surgical history, family history, social history, and previous  encounter notes.   I, Burt Knack, am acting as transcriptionist for Quillian Quince, MD.  I have reviewed the above documentation for accuracy and completeness, and I agree with the above. - Quillian Quince, MD

## 2019-11-14 NOTE — Progress Notes (Signed)
Office: [phone]  /  Fax: [phone]    Date: November 28, 2019   Appointment Start Time: 2:57pm Duration: 33 minutes Provider: Lawerance Gonzalez, Psy.D. Type of Session: Intake for Individual Therapy  Location of Patient: Home Location of Provider: Provider's Home Type of Contact: Telepsychological Visit via MyChart Video Visit  Informed Consent: Prior to proceeding with today's appointment, two pieces of identifying information were obtained. In addition, Leah Gonzalez's physical location at the time of this appointment was obtained as well Leah phone number she could be reached at in the event of technical difficulties. Leah Gonzalez and this provider participated in today's telepsychological service. Of note, with Leah Gonzalez's verbal consent, today's appointment was switched to Leah regular telephone call at 3:08pm due to an unstable connection.  The provider's role was explained to Leah Gonzalez. The provider reviewed and discussed issues of confidentiality, privacy, and limits therein (e.g., reporting obligations). In addition to verbal informed consent, written informed consent for psychological services was obtained prior to the initial appointment. Since the clinic is not Leah 24/7 crisis center, mental health emergency resources were shared and this  provider explained MyChart, e-mail, voicemail, and/or other messaging systems should be utilized only for non-emergency reasons. This provider also explained that information obtained during appointments will be placed in Leah Gonzalez's medical record and relevant information will be shared with other providers at [facility] for coordination of care. Moreover, Leah Gonzalez agreed information may be shared with other [facility] providers as needed for coordination of care. By signing the service agreement document, Leah Gonzalez provided written consent for coordination of care. Prior to initiating telepsychological services, Leah Gonzalez completed an  informed consent document, which included the development of Leah safety plan (i.e., an emergency contact, nearest emergency room, and emergency resources) in the event of an emergency/crisis. Leah Gonzalez expressed understanding of the rationale of the safety plan. Leah Gonzalez verbally acknowledged understanding she is ultimately responsible for understanding her insurance benefits for telepsychological and in-person services. This provider also reviewed confidentiality, as it relates to telepsychological services, as well as the rationale for telepsychological services (i.e., to reduce exposure risk to COVID-19). Leah Gonzalez  acknowledged understanding that appointments cannot be recorded without both party consent and she is aware she is responsible for securing confidentiality on her end of the session. Leah Gonzalez verbally consented to proceed.  Chief Complaint/HPI: Leah Gonzalez was referred by Dr. Quillian Gonzalez due to other depression, with emotional eating. Per the note for the initial visit with Dr. Quillian Gonzalez on November 08, 2019, "Leah Gonzalez notes some emotional eating behaviors, and she would benefit from an appointment with Dr. Dewaine Gonzalez, our Bariatric Psychologist." The note for the initial appointment with Dr. Quillian Gonzalez indicated the following: "Leah Gonzalez's habits were reviewed today and are as follows: Her family eats meals together, her desired weight loss is 52 lbs, she started gaining weight after marriage, her heaviest weight ever was 230 pounds, she is Leah picky eater and doesn't like to eat healthier foods, she snacks frequently in the evenings, she skips meals frequently, she is frequently drinking liquids with calories, she frequently makes poor food choices and she struggles with emotional eating." Leah Gonzalez's Food and Mood (modified PHQ-9) score on November 08, 2019 was 4.  During today's appointment, Leah Gonzalez was verbally administered Leah questionnaire assessing various behaviors related to emotional eating. [name] endorsed the  following: overeat when you are celebrating, not worry about what you eat when you are in Leah good mood and overeat when you are alone, but eat much  less when you are with other people. Leah Gonzalez believes the onset of emotional eating was likely some point in adulthood. She stated she is unsure about the current frequency of emotional eating, adding she sometimes skips meals. In addition, Leah Gonzalez denied Leah history of binge eating. Leah Gonzalez denied Leah history of restricting food intake, purging and engagement in other compensatory strategies, and has never been diagnosed with an eating disorder. She also denied Leah history of treatment for emotional eating. Moreover, Leah Gonzalez indicated celebrations triggers emotional eating. Furthermore, Leah Gonzalez denied other problems of concern.    Mental Status Examination:  Appearance: well groomed and appropriate hygiene  Behavior: appropriate to circumstances Mood: euthymic Affect: mood congruent Speech: normal in rate, volume, and tone Eye Contact: appropriate Psychomotor Activity: appropriate Gait: unable to assess Thought Process: linear, logical, and goal directed  Thought Content/Perception: denies suicidal and homicidal ideation, plan, and intent and no hallucinations, delusions, bizarre thinking or behavior reported or observed Orientation: time, person, place, and purpose of appointment Memory/Concentration: memory, attention, language, and fund of knowledge intact  Insight/Judgment: fair  Family & Psychosocial History: Leah Gonzalez reported she is married and she has two children (ages 4728 and 3033). 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 Additionally, [name] shared her highest level of education obtained is Leah high school diploma. Currently, Leah Gonzalez's social support system consists of her husband and daughter. Moreover, Leah Gonzalez stated she resides with her husband and son.   Medical History:  Past Medical History:    Diagnosis Date  . Back pain   . Dyspnea   . Left hip pain   . Lower extremity edema   . Obesity    Past Surgical History:  Procedure Laterality Date  . ABDOMINAL HYSTERECTOMY    . CESAREAN SECTION    . TUBAL LIGATION     Current Outpatient Medications on File Prior to Visit  Medication Sig Dispense Refill  . celecoxib (CELEBREX) 200 MG capsule Take 200 mg by mouth daily as needed (pain).   0  . cyclobenzaprine (FLEXERIL) 10 MG tablet Take 10 mg by mouth daily as needed for muscle spasms.     Marland Kitchen. dexlansoprazole (DEXILANT) 60 MG capsule Take 60 mg by mouth daily.    . melatonin 3 MG TABS tablet Take 3 mg by mouth at bedtime as needed.    . metFORMIN (GLUCOPHAGE) 500 MG tablet Take 1 tablet (500 mg total) by mouth daily with breakfast. 30 tablet 0  . Multiple Vitamin (MULTIVITAMIN WITH MINERALS) TABS tablet Take 1 tablet by mouth daily.    . Probiotic Product (PROBIOTIC PO) Take 1 tablet by mouth daily.    Marland Kitchen. triamterene-hydrochlorothiazide (MAXZIDE-25) 37.5-25 MG tablet Take 1 tablet by mouth daily.  0  . Vitamin D, Ergocalciferol, (DRISDOL) 1.25 MG (50000 UNIT) CAPS capsule Take 1 capsule (50,000 Units total) by mouth every 7 (seven) days. 4 capsule 0   No current facility-administered medications on file prior to visit.   Mental Health History: Takeshia reported Leah history of marriage counseling approximately 15 years ago. She denied Leah history of psychotropic medications. Shamyah reported there is no history of hospitalizations for psychiatric concerns. Adri endorsed Leah family history of mental health related concerns. She reported her nieces, nephews, and sister are diagnosed with "schizophrenia" and "bipolar." Amen reported there is no history of trauma including psychological, physical  and sexual abuse, as well as neglect.   Ellory described her typical mood lately as "okay." Aside from concerns noted above  and endorsed on the PHQ-9 and GAD-7, Honora reported experiencing worry  thoughts about the well-being of her family's health and her future, noting Leah decrease in self-care. Jillann denied current alcohol use. She denied tobacco use. She denied illicit/recreational substance use. Regarding caffeine intake, Madalynn reported consuming one cup of coffee every other day. Furthermore, Sitlaly indicated she is not experiencing the following: hallucinations and delusions, paranoia, symptoms of mania , social withdrawal, crying spells, panic attacks and decreased motivation. She also denied history of and current suicidal ideation, plan, and intent; history of and current homicidal ideation, plan, and intent; and history of and current engagement in self-harm.  The following strength was reported by Nizhoni: caring. The following strengths were observed by this provider: ability to express thoughts and feelings during the therapeutic session, ability to establish and benefit from Leah therapeutic relationship, willingness to work toward established goal(s) with the clinic and ability to engage in reciprocal conversation.   Legal History: Charnel reported there is no history of legal involvement.   Structured Assessments Results: The Patient Health Questionnaire-9 (PHQ-9) is Leah self-report measure that assesses symptoms and severity of depression over the course of the last two weeks. Avianah obtained Leah score of 8 suggesting mild depression. Yi finds the endorsed symptoms to be not difficult at all. [0= Not at all; 1= Several days; 2= More than half the days; 3= Nearly every day] Little interest or pleasure in doing things 2  Feeling down, depressed, or hopeless 0  Trouble falling or staying asleep, or sleeping too much 0  Feeling tired or having little energy 2  Poor appetite or overeating 2  Feeling bad about yourself --- or that you are Leah failure or have let yourself or your family down 0  Trouble concentrating on things, such as reading the newspaper or watching television 2    Moving or speaking so slowly that other people could have noticed? Or the opposite --- being so fidgety or restless that you have been moving around Leah lot more than usual 0  Thoughts that you would be better off dead or hurting yourself in some way 0  PHQ-9 Score 8    The Generalized Anxiety Disorder-7 (GAD-7) is Leah brief self-report measure that assesses symptoms of anxiety over the course of the last two weeks. Paeton obtained Leah score of 7 suggesting mild anxiety. Ryla finds the endorsed symptoms to be not difficult at all. [0= Not at all; 1= Several days; 2= Over half the days; 3= Nearly every day] Feeling nervous, anxious, on edge 0  Not being able to stop or control worrying 0  Worrying too much about different things 3  Trouble relaxing 2  Being so restless that it's hard to sit still 0  Becoming easily annoyed or irritable 2  Feeling afraid as if something awful might happen 0  GAD-7 Score 7   Interventions:  Conducted Leah chart review Focused on rapport building Verbally administered PHQ-9 and GAD-7 for symptom monitoring Verbally administered Food & Mood questionnaire to assess various behaviors related to emotional eating Provided emphatic reflections and validation Collaborated with patient on Leah treatment goal  Psychoeducation provided regarding physical versus emotional hunger   Provisional DSM-5 Diagnosis(es): 311 (F32.8) Other Specified Depressive Disorder, Emotional Eating Behaviors  Plan: Alyssia appears able and willing to participate as evidenced by collaboration on Leah treatment goal, engagement in reciprocal conversation, and asking questions as needed for clarification. The next appointment will be scheduled in approximately two weeks, which  will be via MyChart Video Visit. The following treatment goal was established: increase coping skills. This provider will regularly review the treatment plan and medical chart to keep informed of status changes. Jaeley expressed  understanding and agreement with the initial treatment plan of care. Gena will be sent Leah handout via e-mail to utilize between now and the next appointment to increase awareness of hunger patterns and subsequent eating. Kizzi provided verbal consent during today's appointment for this provider to send the handout via e-mail.

## 2019-11-22 ENCOUNTER — Encounter (INDEPENDENT_AMBULATORY_CARE_PROVIDER_SITE_OTHER): Payer: Self-pay | Admitting: Family Medicine

## 2019-11-22 ENCOUNTER — Ambulatory Visit (INDEPENDENT_AMBULATORY_CARE_PROVIDER_SITE_OTHER): Payer: BC Managed Care – PPO | Admitting: Family Medicine

## 2019-11-22 ENCOUNTER — Other Ambulatory Visit: Payer: Self-pay

## 2019-11-22 VITALS — BP 112/79 | HR 68 | Temp 98.0°F | Ht 60.0 in | Wt 221.0 lb

## 2019-11-22 DIAGNOSIS — E559 Vitamin D deficiency, unspecified: Secondary | ICD-10-CM

## 2019-11-22 DIAGNOSIS — E1169 Type 2 diabetes mellitus with other specified complication: Secondary | ICD-10-CM | POA: Diagnosis not present

## 2019-11-22 DIAGNOSIS — Z6841 Body Mass Index (BMI) 40.0 and over, adult: Secondary | ICD-10-CM

## 2019-11-22 DIAGNOSIS — E785 Hyperlipidemia, unspecified: Secondary | ICD-10-CM

## 2019-11-22 DIAGNOSIS — Z9189 Other specified personal risk factors, not elsewhere classified: Secondary | ICD-10-CM

## 2019-11-22 MED ORDER — METFORMIN HCL 500 MG PO TABS: 500.0000 mg | ORAL_TABLET | Freq: Every day | ORAL | 0 refills | Status: DC

## 2019-11-22 MED ORDER — VITAMIN D (ERGOCALCIFEROL) 1.25 MG (50000 UNIT) PO CAPS: 50000.0000 [IU] | ORAL_CAPSULE | ORAL | 0 refills | Status: DC

## 2019-11-23 ENCOUNTER — Encounter (INDEPENDENT_AMBULATORY_CARE_PROVIDER_SITE_OTHER): Payer: Self-pay | Admitting: Family Medicine

## 2019-11-25 ENCOUNTER — Encounter (INDEPENDENT_AMBULATORY_CARE_PROVIDER_SITE_OTHER): Payer: Self-pay | Admitting: Family Medicine

## 2019-11-26 NOTE — Progress Notes (Signed)
Chief Complaint:   OBESITY Leah Gonzalez is here to discuss her progress with her obesity treatment plan along with follow-up of her obesity related diagnoses. Leah Gonzalez is on the Category 2 Plan + 100 calories and states she is following her eating plan approximately 95% of the time. Leah Gonzalez states she is doing 0 minutes 0 times per week.  Today's visit was #: 2 Starting weight: 227 lbs Starting date: 11/08/2019 Today's weight: 221 lbs Today's date: 11/22/2019 Total lbs lost to date: 6 Total lbs lost since last in-office visit: 6  Interim History: Leah Gonzalez has done well with weight loss on her Category 2 plan. She noted she wanted to snack more in the PM, and she had minimal hunger in the morning.  Subjective:   1. Hyperlipidemia associated with type 2 diabetes mellitus (HCC) Leah Gonzalez has a new diagnosis of hyperlipidemia associated with diabetes mellitus. Her LDL is elevated, and she is not on statin. She would like to try to control with diet. I discussed labs with the patient today.  2. Vitamin D deficiency Leah Gonzalez has a new diagnosis for Vit D deficiency. She is not on Vit D outside of her multivitamins, and she notes fatigue. I discussed labs with the patient today.  3. Type 2 diabetes mellitus with other specified complication, without long-term current use of insulin (HCC) Leah Gonzalez has a new diagnosis of diabetes mellitus. Her A1c is elevated at 6.5. She notes a family history of diabetes mellitus, and she notes PM polyphagia. I discussed labs with the patient today.  4. At risk for heart disease Leah Gonzalez is at a higher than average risk for cardiovascular disease due to obesity.   Assessment/Plan:   1. Hyperlipidemia associated with type 2 diabetes mellitus (HCC) Cardiovascular risk and specific lipid/LDL goals reviewed. We discussed several lifestyle modifications today. Leah Gonzalez will continue to work on diet, exercise and weight loss efforts. We will recheck labs in 3 months.  Orders and follow up as documented in patient record.   2. Vitamin D deficiency Low Vitamin D level contributes to fatigue and are associated with obesity, breast, and colon cancer. Leah Gonzalez agreed to start prescription Vitamin D 50,000 IU every week with no refills. She will follow-up for routine testing of Vitamin D, at least 2-3 times per year to avoid over-replacement. We will recheck labs in 3 months.  - Vitamin D, Ergocalciferol, (DRISDOL) 1.25 MG (50000 UNIT) CAPS capsule; Take 1 capsule (50,000 Units total) by mouth every 7 (seven) days.  Dispense: 4 capsule; Refill: 0  3. Type 2 diabetes mellitus with other specified complication, without long-term current use of insulin (HCC) Good blood sugar control is important to decrease the likelihood of diabetic complications such as nephropathy, neuropathy, limb loss, blindness, coronary artery disease, and death. Intensive lifestyle modification including diet, exercise and weight loss are the first line of treatment for diabetes. Leah Gonzalez agreed to start metformin 500 mg q AM with no refills. She will continue diet and exercise, and we will recheck labs in 3 months.  - metFORMIN (GLUCOPHAGE) 500 MG tablet; Take 1 tablet (500 mg total) by mouth daily with breakfast.  Dispense: 30 tablet; Refill: 0  4. At risk for heart disease Leah Gonzalez was given approximately 30 minutes of coronary artery disease prevention counseling today. She is 54 y.o. female and has risk factors for heart disease including obesity. We discussed intensive lifestyle modifications today with an emphasis on specific weight loss instructions and strategies.   Repetitive spaced learning was employed today  to elicit superior memory formation and behavioral change.  5. Class 3 severe obesity with serious comorbidity and body mass index (BMI) of 40.0 to 44.9 in adult, unspecified obesity type (HCC) Leah Gonzalez is currently in the action stage of change. As such, her goal is to continue with  weight loss efforts. She has agreed to the Category 2 Plan + 100 calories.   Behavioral modification strategies: decreasing simple carbohydrates.  Leah Gonzalez has agreed to follow-up with our clinic in 2 weeks. She was informed of the importance of frequent follow-up visits to maximize her success with intensive lifestyle modifications for her multiple health conditions.   Objective:   Blood pressure 112/79, pulse 68, temperature 98 F (36.7 C), temperature source Oral, height 5' (1.524 m), weight (!) 221 lb (100.2 kg), SpO2 99 %. Body mass index is 43.16 kg/m.  General: Cooperative, alert, well developed, in no acute distress. HEENT: Conjunctivae and lids unremarkable. Cardiovascular: Regular rhythm.  Lungs: Normal work of breathing. Neurologic: No focal deficits.   Lab Results  Component Value Date   CREATININE 0.67 11/08/2019   BUN 12 11/08/2019   NA 141 11/08/2019   K 4.6 11/08/2019   CL 101 11/08/2019   CO2 26 11/08/2019   Lab Results  Component Value Date   ALT 23 11/08/2019   AST 21 11/08/2019   ALKPHOS 92 11/08/2019   BILITOT <0.2 11/08/2019   Lab Results  Component Value Date   HGBA1C 6.5 (H) 11/08/2019   Lab Results  Component Value Date   INSULIN 12.3 11/08/2019   Lab Results  Component Value Date   TSH 1.760 11/08/2019   Lab Results  Component Value Date   CHOL 242 (H) 11/08/2019   HDL 46 11/08/2019   LDLCALC 175 (H) 11/08/2019   TRIG 118 11/08/2019   Lab Results  Component Value Date   WBC 6.8 11/08/2019   HGB 12.2 11/08/2019   HCT 39.0 11/08/2019   MCV 81 11/08/2019   PLT 326 11/08/2019   No results found for: IRON, TIBC, FERRITIN  Attestation Statements:   Reviewed by clinician on day of visit: allergies, medications, problem list, medical history, surgical history, family history, social history, and previous encounter notes.   I, Burt Knack, am acting as transcriptionist for Quillian Quince, MD.  I have reviewed the above  documentation for accuracy and completeness, and I agree with the above. -  Quillian Quince, MD

## 2019-11-27 NOTE — Telephone Encounter (Signed)
Please advise 

## 2019-11-28 ENCOUNTER — Other Ambulatory Visit: Payer: Self-pay

## 2019-11-28 ENCOUNTER — Telehealth (INDEPENDENT_AMBULATORY_CARE_PROVIDER_SITE_OTHER): Payer: BC Managed Care – PPO | Admitting: Psychology

## 2019-11-28 DIAGNOSIS — F3289 Other specified depressive episodes: Secondary | ICD-10-CM | POA: Diagnosis not present

## 2019-12-03 NOTE — Progress Notes (Signed)
  Office: [phone]  /  Fax: [phone]    Date: December 17, 2019   Appointment Start Time: 2:30pm Duration: 25 minutes Provider: Lawerance Cruel, Psy.D. 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 female presenting via MyChart Video Visit for a follow-up appointment to address the previously established treatment goal of increasing coping skills. Today's appointment was a telepsychological visit due to COVID-19. [name] provided verbal consent for today's telepsychological appointment and she is aware she is responsible for securing confidentiality on her end of the session. Prior to proceeding with today's appointment, [name]'s physical location at the time of this appointment was obtained as well a phone number she could be reached at in the event of technical difficulties. Kamree and this provider participated in today's telepsychological service. Of note, today's appointment was switched to a regular telephone call at 2:36pm with [name]'s verbal consent due to feedback on [name]'s end.   This provider conducted a brief check-in. Burnell reported, "It's been going good." She added she continues to lose weight and noted a reduction in emotional eating at night. Reviewed emotional and physical hunger. Psychoeducation regarding triggers for emotional eating was provided. Tonjia was provided a handout, and encouraged to utilize the handout between now and the next appointment to increase awareness of triggers and frequency. [name] agreed. This provider also discussed behavioral strategies for specific triggers, such as placing the utensil down when conversing to avoid mindless eating. [name] provided verbal consent during today's appointment for this provider to send a handout about triggers via e-mail. [name] was  receptive to today's appointment as evidenced by openness to sharing, responsiveness to feedback, and willingness to explore triggers for emotional eating.  Mental Status Examination:  Appearance: well groomed and appropriate hygiene  Behavior: appropriate to circumstances Mood: euthymic Affect: mood congruent Speech: normal in rate, volume, and tone Eye Contact: appropriate Psychomotor Activity: appropriate Gait: unable to assess Thought Process: linear, logical, and goal directed  Thought Content/Perception: no hallucinations, delusions, bizarre thinking or behavior reported or observed and no evidence of suicidal and homicidal ideation, plan, and intent Orientation: time, person, place, and purpose of appointment Memory/Concentration: memory, attention, language, and fund of knowledge intact  Insight/Judgment: good   Interventions:  Conducted a brief chart review Provided empathic reflections and validation Reviewed content from the previous session Employed supportive psychotherapy interventions to facilitate reduced distress and to improve coping skills with identified stressors Psychoeducation provided regarding triggers for emotional eating  DSM-5 Diagnosis(es): 311 (F32.8) Other Specified Depressive Disorder, Emotional Eating Behaviors  Treatment Goal & Progress: During the initial appointment with this provider, the following treatment goal was established: increase coping skills. [name] has demonstrated progress in her goal as evidenced by increased awareness of hunger patterns.   Plan: The next appointment will be scheduled in approximately two weeks, which will be via MyChart Video Visit. The next session will focus on working towards the established treatment goal.

## 2019-12-12 ENCOUNTER — Ambulatory Visit (INDEPENDENT_AMBULATORY_CARE_PROVIDER_SITE_OTHER): Payer: BC Managed Care – PPO | Admitting: Family Medicine

## 2019-12-12 ENCOUNTER — Encounter (INDEPENDENT_AMBULATORY_CARE_PROVIDER_SITE_OTHER): Payer: Self-pay | Admitting: Family Medicine

## 2019-12-12 ENCOUNTER — Other Ambulatory Visit: Payer: Self-pay

## 2019-12-12 VITALS — BP 113/78 | HR 80 | Temp 97.9°F | Ht 60.0 in | Wt 212.0 lb

## 2019-12-12 DIAGNOSIS — E1169 Type 2 diabetes mellitus with other specified complication: Secondary | ICD-10-CM | POA: Diagnosis not present

## 2019-12-12 DIAGNOSIS — Z9189 Other specified personal risk factors, not elsewhere classified: Secondary | ICD-10-CM | POA: Diagnosis not present

## 2019-12-12 DIAGNOSIS — E559 Vitamin D deficiency, unspecified: Secondary | ICD-10-CM

## 2019-12-12 DIAGNOSIS — Z6841 Body Mass Index (BMI) 40.0 and over, adult: Secondary | ICD-10-CM

## 2019-12-12 MED ORDER — VITAMIN D (ERGOCALCIFEROL) 1.25 MG (50000 UNIT) PO CAPS: 50000.0000 [IU] | ORAL_CAPSULE | ORAL | 0 refills | Status: DC

## 2019-12-12 MED ORDER — METFORMIN HCL 500 MG PO TABS: 500.0000 mg | ORAL_TABLET | Freq: Every day | ORAL | 0 refills | Status: DC

## 2019-12-13 ENCOUNTER — Other Ambulatory Visit (INDEPENDENT_AMBULATORY_CARE_PROVIDER_SITE_OTHER): Payer: Self-pay | Admitting: Family Medicine

## 2019-12-13 DIAGNOSIS — E559 Vitamin D deficiency, unspecified: Secondary | ICD-10-CM

## 2019-12-13 NOTE — Progress Notes (Signed)
Chief Complaint:   OBESITY Leah Gonzalez is here to discuss her progress with her obesity treatment plan along with follow-up of her obesity related diagnoses. Leah Gonzalez is on the Category 2 Plan + 100 calories and states she is following her eating plan approximately 100% of the time. Leah Gonzalez states she is doing 0 minutes 0 times per week.  Today's visit was #: 3 Starting weight: 227 lbs Starting date: 11/08/2019 Today's weight: 212 lbs Today's date: 12/12/2019 Total lbs lost to date: 15 Total lbs lost since last in-office visit: 9  Interim History: Leah Gonzalez continues to do well with weight loss. She is tolerating her Category 2 plan well and her hunger is mostly controlled. Her family is trying to eat healthy with her.  Subjective:   1. Type 2 diabetes mellitus with other specified complication, without long-term current use of insulin (HCC) Leah Gonzalez started metformin, and she denies GI upset.  2. Vitamin D deficiency Leah Gonzalez is stable on Vit D, and she denies nausea, vomiting, or muscle weakness.  3. At risk for heart disease Leah Gonzalez is at a higher than average risk for cardiovascular disease due to obesity.   Assessment/Plan:   1. Type 2 diabetes mellitus with other specified complication, without long-term current use of insulin (HCC) Good blood sugar control is important to decrease the likelihood of diabetic complications such as nephropathy, neuropathy, limb loss, blindness, coronary artery disease, and death. Intensive lifestyle modification including diet, exercise and weight loss are the first line of treatment for diabetes. We will refill metformin for 1 month, and Leah Gonzalez will follow up as directed.  - metFORMIN (GLUCOPHAGE) 500 MG tablet; Take 1 tablet (500 mg total) by mouth daily with breakfast.  Dispense: 30 tablet; Refill: 0  2. Vitamin D deficiency Low Vitamin D level contributes to fatigue and are associated with obesity, breast, and colon cancer. We will refill  prescription Vitamin D for 1 month. Leah Gonzalez will follow-up for routine testing of Vitamin D, at least 2-3 times per year to avoid over-replacement.  - Vitamin D, Ergocalciferol, (DRISDOL) 1.25 MG (50000 UNIT) CAPS capsule; Take 1 capsule (50,000 Units total) by mouth every 7 (seven) days.  Dispense: 4 capsule; Refill: 0  3. At risk for heart disease Leah Gonzalez was given approximately 15 minutes of coronary artery disease prevention counseling today. She is 54 y.o. female and has risk factors for heart disease including obesity. We discussed intensive lifestyle modifications today with an emphasis on specific weight loss instructions and strategies.   Repetitive spaced learning was employed today to elicit superior memory formation and behavioral change.  4. Class 3 severe obesity with serious comorbidity and body mass index (BMI) of 40.0 to 44.9 in adult, unspecified obesity type (HCC) Leah Gonzalez is currently in the action stage of change. As such, her goal is to continue with weight loss efforts. She has agreed to the Category 2 Plan.   Behavioral modification strategies: increasing lean protein intake and dealing with family or coworker sabotage.  Leah Gonzalez has agreed to follow-up with our clinic in 3 weeks. She was informed of the importance of frequent follow-up visits to maximize her success with intensive lifestyle modifications for her multiple health conditions.   Objective:   Blood pressure 113/78, pulse 80, temperature 97.9 F (36.6 C), temperature source Oral, height 5' (1.524 m), weight 212 lb (96.2 kg), SpO2 99 %. Body mass index is 41.4 kg/m.  General: Cooperative, alert, well developed, in no acute distress. HEENT: Conjunctivae and lids unremarkable. Cardiovascular: Regular  rhythm.  Lungs: Normal work of breathing. Neurologic: No focal deficits.   Lab Results  Component Value Date   CREATININE 0.67 11/08/2019   BUN 12 11/08/2019   NA 141 11/08/2019   K 4.6 11/08/2019   CL  101 11/08/2019   CO2 26 11/08/2019   Lab Results  Component Value Date   ALT 23 11/08/2019   AST 21 11/08/2019   ALKPHOS 92 11/08/2019   BILITOT <0.2 11/08/2019   Lab Results  Component Value Date   HGBA1C 6.5 (H) 11/08/2019   Lab Results  Component Value Date   INSULIN 12.3 11/08/2019   Lab Results  Component Value Date   TSH 1.760 11/08/2019   Lab Results  Component Value Date   CHOL 242 (H) 11/08/2019   HDL 46 11/08/2019   LDLCALC 175 (H) 11/08/2019   TRIG 118 11/08/2019   Lab Results  Component Value Date   WBC 6.8 11/08/2019   HGB 12.2 11/08/2019   HCT 39.0 11/08/2019   MCV 81 11/08/2019   PLT 326 11/08/2019   No results found for: IRON, TIBC, FERRITIN  Attestation Statements:   Reviewed by clinician on day of visit: allergies, medications, problem list, medical history, surgical history, family history, social history, and previous encounter notes.   I, Burt Knack, am acting as transcriptionist for Quillian Quince, MD.  I have reviewed the above documentation for accuracy and completeness, and I agree with the above. -  Quillian Quince, MD

## 2019-12-16 ENCOUNTER — Other Ambulatory Visit (INDEPENDENT_AMBULATORY_CARE_PROVIDER_SITE_OTHER): Payer: Self-pay | Admitting: Family Medicine

## 2019-12-16 DIAGNOSIS — E1169 Type 2 diabetes mellitus with other specified complication: Secondary | ICD-10-CM

## 2019-12-17 ENCOUNTER — Telehealth (INDEPENDENT_AMBULATORY_CARE_PROVIDER_SITE_OTHER): Payer: BC Managed Care – PPO | Admitting: Psychology

## 2019-12-17 ENCOUNTER — Other Ambulatory Visit: Payer: Self-pay

## 2019-12-17 DIAGNOSIS — F3289 Other specified depressive episodes: Secondary | ICD-10-CM

## 2019-12-18 ENCOUNTER — Encounter (INDEPENDENT_AMBULATORY_CARE_PROVIDER_SITE_OTHER): Payer: Self-pay | Admitting: Family Medicine

## 2019-12-18 NOTE — Progress Notes (Signed)
  Office: [phone]  /  Fax: [phone]    Date: January 01, 2020   Appointment Start Time: 2:27pm Duration: 22 minutes Provider: Lawerance Cruel, Psy.D. Type of Session: Individual Therapy  Location of Patient: Home Location of Provider: [facility] Type of Contact: Telepsychological Visit via MyChart Video Visit  Session Content: Leah Gonzalez is a 54 y.o. female presenting for a follow-up appointment to address the previously established treatment goal of increasing coping skills. Today's appointment was a telepsychological visit due to COVID-19. Leah Gonzalez provided verbal consent for today's telepsychological appointment and she is aware she is responsible for securing confidentiality on her end of the session. Prior to proceeding with today's appointment, Leah Gonzalez's physical location at the time of this appointment was obtained as well a phone number she could be reached at in the event of technical difficulties. Leah Gonzalez and this provider participated in today's telepsychological service.   This provider conducted a brief check-in. Leah Gonzalez reported she tested positive for COVID-19. She noted she is experiencing challenges sleeping; therefore, she was encouraged to reach out to her PCP. Additionally, Leah Gonzalez reported challenges with eating since testing positive. She was encouraged to focus on feeling better and reach out to Leah Gonzalez via [employer]; she agreed. Triggers for emotional eating were reviewed. She reported increased awareness has helped reduce emotional eating. Moreover, psychoeducation regarding mindfulness was provided. A handout was provided to Leah Gonzalez with further information regarding mindfulness, including exercises. This provider also explained the benefit of mindfulness as it relates to emotional eating. Leah Gonzalez was encouraged to engage in the provided exercises between now and the next appointment with this provider. Leah Gonzalez agreed. During today's appointment,  Leah Gonzalez was led through a mindfulness exercise involving her senses. Leah Gonzalez provided verbal consent during today's appointment for this provider to send a handout about mindfulness via e-mail. Leah Gonzalez was receptive to today's appointment as evidenced by openness to sharing, responsiveness to feedback, and willingness to engage in mindfulness exercises to assist with coping.  Mental Status Examination:  Appearance: well groomed and appropriate hygiene  Behavior: appropriate to circumstances Mood: euthymic Affect: mood congruent Speech: normal in rate, volume, and tone Eye Contact: appropriate Psychomotor Activity: appropriate Gait: unable to assess Thought Process: linear, logical, and goal directed  Thought Content/Perception: no hallucinations, delusions, bizarre thinking or behavior reported or observed and no evidence of suicidal and homicidal ideation, plan, and intent Orientation: time, person, place, and purpose of appointment Memory/Concentration: memory, attention, language, and fund of knowledge intact  Insight/Judgment: good  Interventions:  Conducted a brief chart review Provided empathic reflections and validation Reviewed content from the previous session Employed supportive psychotherapy interventions to facilitate reduced distress and to improve coping skills with identified stressors Psychoeducation provided regarding mindfulness Engaged patient in mindfulness exercise(s) Employed acceptance and commitment interventions to emphasize mindfulness and acceptance without struggle  DSM-5 Diagnosis(es): 311 (F32.8) Other Specified Depressive Disorder, Emotional Eating Behaviors  Treatment Goal & Progress: During the initial appointment with this provider, the following treatment goal was established: increase coping skills. Leah Gonzalez has demonstrated progress in her goal as evidenced by increased awareness of hunger patterns and increased awareness of triggers for emotional  eating. Leah Gonzalez also demonstrates willingness to engage in mindfulness exercises.  Plan: The next appointment will be scheduled in approximately 2-3 weeks, which will be via MyChart Video Visit. The next session will focus on working towards the established treatment goal.

## 2019-12-18 NOTE — Telephone Encounter (Signed)
Please review and advise.

## 2019-12-30 ENCOUNTER — Other Ambulatory Visit: Payer: Self-pay

## 2019-12-30 ENCOUNTER — Ambulatory Visit (HOSPITAL_COMMUNITY)
Admission: RE | Admit: 2019-12-30 | Discharge: 2019-12-30 | Disposition: A | Discharge status: Home / Self Care | Payer: BC Managed Care – PPO | Source: Ambulatory Visit | Attending: Physician Assistant | Admitting: Physician Assistant

## 2019-12-30 DIAGNOSIS — U071 COVID-19: Secondary | ICD-10-CM | POA: Insufficient documentation

## 2019-12-30 NOTE — ED Triage Notes (Signed)
Nurse visit. Pt here to get COVID test, because husband tested +. PT denies sx at this time.

## 2019-12-31 LAB — SARS CORONAVIRUS 2 (TAT 6-24 HRS): SARS Coronavirus 2: POSITIVE — AB

## 2020-01-01 ENCOUNTER — Telehealth: Payer: Self-pay | Admitting: Adult Health

## 2020-01-01 ENCOUNTER — Telehealth (INDEPENDENT_AMBULATORY_CARE_PROVIDER_SITE_OTHER): Payer: BC Managed Care – PPO | Admitting: Psychology

## 2020-01-01 ENCOUNTER — Other Ambulatory Visit: Payer: Self-pay | Admitting: Adult Health

## 2020-01-01 ENCOUNTER — Encounter: Payer: Self-pay | Admitting: Adult Health

## 2020-01-01 DIAGNOSIS — F3289 Other specified depressive episodes: Secondary | ICD-10-CM | POA: Diagnosis not present

## 2020-01-01 DIAGNOSIS — U071 COVID-19: Secondary | ICD-10-CM

## 2020-01-01 NOTE — Progress Notes (Signed)
I connected by phone with Leah Gonzalez on 01/01/2020 at 4:06 PM to discuss the potential use of a new treatment for mild to moderate COVID-19 viral infection in non-hospitalized patients.  This patient is a 54 y.o. female that meets the FDA criteria for Emergency Use Authorization of COVID monoclonal antibody casirivimab/imdevimab.  Has a (+) direct SARS-CoV-2 viral test result  Has mild or moderate COVID-19   Is NOT hospitalized due to COVID-19  Is within 10 days of symptom onset  Has at least one of the high risk factor(s) for progression to severe COVID-19 and/or hospitalization as defined in EUA.  Specific high risk criteria : BMI > 25 and Diabetes   I have spoken and communicated the following to the patient or parent/caregiver regarding COVID monoclonal antibody treatment:  1. FDA has authorized the emergency use for the treatment of mild to moderate COVID-19 in adults and pediatric patients with positive results of direct SARS-CoV-2 viral testing who are 19 years of age and older weighing at least 40 kg, and who are at high risk for progressing to severe COVID-19 and/or hospitalization.  2. The significant known and potential risks and benefits of COVID monoclonal antibody, and the extent to which such potential risks and benefits are unknown.  3. Information on available alternative treatments and the risks and benefits of those alternatives, including clinical trials.  4. Patients treated with COVID monoclonal antibody should continue to self-isolate and use infection control measures (e.g., wear mask, isolate, social distance, avoid sharing personal items, clean and disinfect "high touch" surfaces, and frequent handwashing) according to CDC guidelines.   5. The patient or parent/caregiver has the option to accept or refuse COVID monoclonal antibody treatment.  After reviewing this information with the patient, The patient agreed to proceed with receiving  casirivimab\imdevimab infusion and will be provided a copy of the Fact sheet prior to receiving the infusion. [name] 01/01/2020 4:06 PM

## 2020-01-01 NOTE — Telephone Encounter (Signed)
Called and LMOM regarding monoclonal antibody treatment for COVID 19 given to those who are at risk for complications and/or hospitalization of the virus.  Patient meets criteria based on: BMI and diabetes  Call back number given: [phone]  My chart message: sent  Lillard Anes, NP

## 2020-01-02 ENCOUNTER — Ambulatory Visit (INDEPENDENT_AMBULATORY_CARE_PROVIDER_SITE_OTHER): Payer: BC Managed Care – PPO | Admitting: Family Medicine

## 2020-01-02 ENCOUNTER — Ambulatory Visit (HOSPITAL_COMMUNITY)
Admission: RE | Admit: 2020-01-02 | Discharge: 2020-01-02 | Disposition: A | Discharge status: Home / Self Care | Payer: BC Managed Care – PPO | Source: Ambulatory Visit | Attending: Pulmonary Disease | Admitting: Pulmonary Disease

## 2020-01-02 DIAGNOSIS — U071 COVID-19: Secondary | ICD-10-CM | POA: Diagnosis not present

## 2020-01-02 MED ORDER — FAMOTIDINE IN NACL 20-0.9 MG/50ML-% IV SOLN: 20.0000 mg | Freq: Once | INTRAVENOUS | Status: DC | PRN

## 2020-01-02 MED ORDER — ALBUTEROL SULFATE HFA 108 (90 BASE) MCG/ACT IN AERS: 2.0000 | INHALATION_SPRAY | Freq: Once | RESPIRATORY_TRACT | Status: DC | PRN

## 2020-01-02 MED ORDER — EPINEPHRINE 0.3 MG/0.3ML IJ SOAJ: 0.3000 mg | Freq: Once | INTRAMUSCULAR | Status: DC | PRN

## 2020-01-02 MED ORDER — DIPHENHYDRAMINE HCL 50 MG/ML IJ SOLN: 50.0000 mg | Freq: Once | INTRAMUSCULAR | Status: DC | PRN

## 2020-01-02 MED ORDER — SODIUM CHLORIDE 0.9 % IV SOLN
1200.0000 mg | Freq: Once | INTRAVENOUS | Status: AC
  Administered 2020-01-02: 1200 mg via INTRAVENOUS

## 2020-01-02 MED ORDER — METHYLPREDNISOLONE SODIUM SUCC 125 MG IJ SOLR: 125.0000 mg | Freq: Once | INTRAMUSCULAR | Status: DC | PRN

## 2020-01-02 MED ORDER — SODIUM CHLORIDE 0.9 % IV SOLN: INTRAVENOUS | Status: DC | PRN

## 2020-01-02 NOTE — Progress Notes (Signed)
°  Diagnosis: COVID-19 ° °Physician: Dr Patrick Wright ° °Procedure: [facility] Med: casirivimab\imdevimab infusion - Provided patient with casirivimab\imdevimab fact sheet for patients, parents and caregivers prior to infusion. ° °Complications: No immediate complications noted. ° °Discharge: Discharged home  ° °Oretha Weismann L °01/02/2020 ° ° °

## 2020-01-02 NOTE — Discharge Instructions (Signed)

## 2020-01-06 ENCOUNTER — Encounter (INDEPENDENT_AMBULATORY_CARE_PROVIDER_SITE_OTHER): Payer: Self-pay | Admitting: Family Medicine

## 2020-01-09 NOTE — Progress Notes (Unsigned)
Office: [phone]  /  Fax: [phone]    Date: January 23, 2020   Appointment Start Time: *** Duration: *** minutes Provider: Lawerance Cruel, Psy.D. Type of Session: Individual Therapy  Location of Patient: {gbptloc:23249} Location of Provider: Provider's Home Type of Contact: Telepsychological Visit via MyChart Video Visit  Session Content: Leah Gonzalez is a 54 y.o. female presenting for a follow-up appointment to address the previously established treatment goal of increasing coping skills. Today's appointment was a telepsychological visit due to COVID-19. Leah Gonzalez provided verbal consent for today's telepsychological appointment and she is aware she is responsible for securing confidentiality on her end of the session. Prior to proceeding with today's appointment, Leah Gonzalez's physical location at the time of this appointment was obtained as well a phone number she could be reached at in the event of technical difficulties. Leah Gonzalez and this provider participated in today's telepsychological service.   This provider conducted a brief check-in and verbally administered the PHQ-9 and GAD-7. *** Leah Gonzalez was receptive to today's appointment as evidenced by openness to sharing, responsiveness to feedback, and {gbreceptiveness:23401}.  Mental Status Examination:  Appearance: {Appearance:22431} Behavior: {Behavior:22445} Mood: {gbmood:21757} Affect: {Affect:22436} Speech: {Speech:22432} Eye Contact: {Eye Contact:22433} Psychomotor Activity: {Motor Activity:22434} Gait: {gbgait:23404} Thought Process: {thought process:22448}  Thought Content/Perception: {disturbances:22451} Orientation: {Orientation:22437} Memory/Concentration: {gbcognition:22449} Insight/Judgment: {Insight:22446}  Structured Assessments Results: The Patient Health Questionnaire-9 (PHQ-9) is a self-report measure that assesses symptoms and severity of depression over the course of the last two weeks. Leah Gonzalez obtained a score  of *** suggesting {GBPHQ9SEVERITY:21752}. Leah Gonzalez finds the endorsed symptoms to be {gbphq9difficulty:21754}. [0= Not at all; 1= Several days; 2= More than half the days; 3= Nearly every day] Little interest or pleasure in doing things ***  Feeling down, depressed, or hopeless ***  Trouble falling or staying asleep, or sleeping too much ***  Feeling tired or having little energy ***  Poor appetite or overeating ***  Feeling bad about yourself --- or that you are a failure or have let yourself or your family down ***  Trouble concentrating on things, such as reading the newspaper or watching television ***  Moving or speaking so slowly that other people could have noticed? Or the opposite --- being so fidgety or restless that you have been moving around a lot more than usual ***  Thoughts that you would be better off dead or hurting yourself in some way ***  PHQ-9 Score ***    The Generalized Anxiety Disorder-7 (GAD-7) is a brief self-report measure that assesses symptoms of anxiety over the course of the last two weeks. Leah Gonzalez obtained a score of *** suggesting {gbgad7severity:21753}. Leah Gonzalez finds the endorsed symptoms to be {gbphq9difficulty:21754}. [0= Not at all; 1= Several days; 2= Over half the days; 3= Nearly every day] Feeling nervous, anxious, on edge ***  Not being able to stop or control worrying ***  Worrying too much about different things ***  Trouble relaxing ***  Being so restless that it's hard to sit still ***  Becoming easily annoyed or irritable ***  Feeling afraid as if something awful might happen ***  GAD-7 Score ***   Interventions:  {Interventions for Progress Notes:23405}  DSM-5 Diagnosis(es): 311 (F32.8) Other Specified Depressive Disorder, Emotional Eating Behaviors  Treatment Goal & Progress: During the initial appointment with this provider, the following treatment goal was established: increase coping skills. Leah Gonzalez has demonstrated progress in her goal  as evidenced by {gbtxprogress:22839}. Leah Gonzalez also {gbtxprogress2:22951}.  Plan: The next appointment will be scheduled in {gbweeks:21758}, which will be {  gbtxmodality:23402}. The next session will focus on {Plan for Next Appointment:23400}.

## 2020-01-12 ENCOUNTER — Other Ambulatory Visit (INDEPENDENT_AMBULATORY_CARE_PROVIDER_SITE_OTHER): Payer: Self-pay | Admitting: Family Medicine

## 2020-01-12 DIAGNOSIS — E559 Vitamin D deficiency, unspecified: Secondary | ICD-10-CM

## 2020-01-14 ENCOUNTER — Other Ambulatory Visit (INDEPENDENT_AMBULATORY_CARE_PROVIDER_SITE_OTHER): Payer: Self-pay | Admitting: Family Medicine

## 2020-01-14 DIAGNOSIS — E1169 Type 2 diabetes mellitus with other specified complication: Secondary | ICD-10-CM

## 2020-01-15 NOTE — Telephone Encounter (Signed)
Mychart message sent to patient.

## 2020-01-16 ENCOUNTER — Encounter (INDEPENDENT_AMBULATORY_CARE_PROVIDER_SITE_OTHER): Payer: Self-pay | Admitting: Family Medicine

## 2020-01-16 NOTE — Telephone Encounter (Addendum)
Pt needs to schedule f/u appointment.

## 2020-01-23 ENCOUNTER — Telehealth (INDEPENDENT_AMBULATORY_CARE_PROVIDER_SITE_OTHER): Payer: Self-pay | Admitting: Psychology

## 2020-01-28 ENCOUNTER — Other Ambulatory Visit: Payer: Self-pay

## 2020-01-28 ENCOUNTER — Encounter (INDEPENDENT_AMBULATORY_CARE_PROVIDER_SITE_OTHER): Payer: Self-pay | Admitting: Family Medicine

## 2020-01-28 ENCOUNTER — Ambulatory Visit (INDEPENDENT_AMBULATORY_CARE_PROVIDER_SITE_OTHER): Payer: BC Managed Care – PPO | Admitting: Family Medicine

## 2020-01-28 VITALS — BP 109/72 | HR 68 | Temp 97.4°F | Ht 60.0 in | Wt 203.0 lb

## 2020-01-28 DIAGNOSIS — Z6839 Body mass index (BMI) 39.0-39.9, adult: Secondary | ICD-10-CM | POA: Diagnosis not present

## 2020-01-28 DIAGNOSIS — E1169 Type 2 diabetes mellitus with other specified complication: Secondary | ICD-10-CM

## 2020-01-28 DIAGNOSIS — E559 Vitamin D deficiency, unspecified: Secondary | ICD-10-CM | POA: Diagnosis not present

## 2020-01-28 DIAGNOSIS — Z9189 Other specified personal risk factors, not elsewhere classified: Secondary | ICD-10-CM | POA: Diagnosis not present

## 2020-01-28 MED ORDER — VITAMIN D (ERGOCALCIFEROL) 1.25 MG (50000 UNIT) PO CAPS: 50000.0000 [IU] | ORAL_CAPSULE | ORAL | 0 refills | Status: DC

## 2020-01-28 MED ORDER — METFORMIN HCL 500 MG PO TABS: 500.0000 mg | ORAL_TABLET | Freq: Every day | ORAL | 0 refills | Status: DC

## 2020-01-28 NOTE — Progress Notes (Signed)
Chief Complaint:   OBESITY Leah Gonzalez is here to discuss her progress with her obesity treatment plan along with follow-up of her obesity related diagnoses. Leah Gonzalez is on the Category 2 Plan and states she is following her eating plan approximately 85% of the time. Leah Gonzalez states she is walking in the backyard and dancing for 20 minutes 3 times per week.  Today's visit was #: 4 Starting weight: 227 lbs Starting date: 11/08/2019 Today's weight: 203 lbs Today's date: 01/28/2020 Total lbs lost to date: 24 Total lbs lost since last in-office visit: 9  Interim History: Leah Gonzalez has been recovering from COVID19 infection, and she hasn't been able to eat all of her food at that time. She is feeling much better now and is ready to get back on track.  Subjective:   1. Vitamin D deficiency Leah Gonzalez is on Vit D, and her last Vit D level was not at goal. She denies nausea or vomiting.  2. Type 2 diabetes mellitus with other specified complication, without long-term current use of insulin (HCC) Leah Gonzalez is working on diet and weight loss, and she denies hypoglycemia. She is tolerating metformin well.  3. At risk for dehydration Leah Gonzalez is at risk for dehydration due to recent illness.  Assessment/Plan:   1. Vitamin D deficiency Low Vitamin D level contributes to fatigue and are associated with obesity, breast, and colon cancer. We will refill prescription Vitamin D for 1 month. Leah Gonzalez will follow-up for routine testing of Vitamin D, at least 2-3 times per year to avoid over-replacement.  - Vitamin D, Ergocalciferol, (DRISDOL) 1.25 MG (50000 UNIT) CAPS capsule; Take 1 capsule (50,000 Units total) by mouth every 7 (seven) days.  Dispense: 4 capsule; Refill: 0  2. Type 2 diabetes mellitus with other specified complication, without long-term current use of insulin (HCC) Good blood sugar control is important to decrease the likelihood of diabetic complications such as nephropathy, neuropathy, limb  loss, blindness, coronary artery disease, and death. Intensive lifestyle modification including diet, exercise and weight loss are the first line of treatment for diabetes.  Leah Gonzalez agreed to continue metformin and we will refill for 1 month.  - metFORMIN (GLUCOPHAGE) 500 MG tablet; Take 1 tablet (500 mg total) by mouth daily with breakfast.  Dispense: 30 tablet; Refill: 0  3. At risk for dehydration Leah Gonzalez was given approximately 15 minutes dehydration prevention counseling today. Leah Gonzalez is at risk for dehydration due to weight loss and current medication(s). She was encouraged to hydrate and monitor fluid status to avoid dehydration as well as weight loss plateaus.   4. Class 2 severe obesity with serious comorbidity and body mass index (BMI) of 39.0 to 39.9 in adult, unspecified obesity type (HCC) Leah Gonzalez is currently in the action stage of change. As such, her goal is to continue with weight loss efforts. She has agreed to the Category 2 Plan + 100 calories.   Exercise goals: As is.  Behavioral modification strategies: increasing lean protein intake, increasing water intake and no skipping meals.  Leah Gonzalez has agreed to follow-up with our clinic in 2 to 3 weeks. She was informed of the importance of frequent follow-up visits to maximize her success with intensive lifestyle modifications for her multiple health conditions.   Objective:   Blood pressure 109/72, pulse 68, temperature (!) 97.4 F (36.3 C), height 5' (1.524 m), weight 203 lb (92.1 kg), SpO2 97 %. Body mass index is 39.65 kg/m.  General: Cooperative, alert, well developed, in no acute distress. HEENT: Conjunctivae  and lids unremarkable. Cardiovascular: Regular rhythm.  Lungs: Normal work of breathing. Neurologic: No focal deficits.   Lab Results  Component Value Date   CREATININE 0.67 11/08/2019   BUN 12 11/08/2019   NA 141 11/08/2019   K 4.6 11/08/2019   CL 101 11/08/2019   CO2 26 11/08/2019   Lab Results    Component Value Date   ALT 23 11/08/2019   AST 21 11/08/2019   ALKPHOS 92 11/08/2019   BILITOT <0.2 11/08/2019   Lab Results  Component Value Date   HGBA1C 6.5 (H) 11/08/2019   Lab Results  Component Value Date   INSULIN 12.3 11/08/2019   Lab Results  Component Value Date   TSH 1.760 11/08/2019   Lab Results  Component Value Date   CHOL 242 (H) 11/08/2019   HDL 46 11/08/2019   LDLCALC 175 (H) 11/08/2019   TRIG 118 11/08/2019   Lab Results  Component Value Date   WBC 6.8 11/08/2019   HGB 12.2 11/08/2019   HCT 39.0 11/08/2019   MCV 81 11/08/2019   PLT 326 11/08/2019   No results found for: IRON, TIBC, FERRITIN  Attestation Statements:   Reviewed by clinician on day of visit: allergies, medications, problem list, medical history, surgical history, family history, social history, and previous encounter notes.   I, Burt Knack, am acting as transcriptionist for Quillian Quince, MD.  I have reviewed the above documentation for accuracy and completeness, and I agree with the above. -  Quillian Quince, MD

## 2020-02-05 ENCOUNTER — Telehealth (INDEPENDENT_AMBULATORY_CARE_PROVIDER_SITE_OTHER): Payer: Self-pay | Admitting: Psychology

## 2020-02-05 NOTE — Telephone Encounter (Signed)
  Office: [phone]  /  Fax: [phone]  Date of Call: February 05, 2020  Time of Call: 10:47am Provider: Lawerance Cruel, PsyD  CONTENT: This provider called [facility]le to check-in and schedule a follow-up appointment. A HIPAA compliant voicemail was left requesting a call back.   PLAN: This provider will wait for Anjanette to call back. No further follow-up planned by this provider.

## 2020-02-08 ENCOUNTER — Other Ambulatory Visit (INDEPENDENT_AMBULATORY_CARE_PROVIDER_SITE_OTHER): Payer: Self-pay | Admitting: Family Medicine

## 2020-02-08 DIAGNOSIS — E559 Vitamin D deficiency, unspecified: Secondary | ICD-10-CM

## 2020-02-18 ENCOUNTER — Ambulatory Visit (INDEPENDENT_AMBULATORY_CARE_PROVIDER_SITE_OTHER): Payer: BC Managed Care – PPO | Admitting: Family Medicine

## 2020-02-18 ENCOUNTER — Other Ambulatory Visit: Payer: Self-pay

## 2020-02-18 ENCOUNTER — Encounter (INDEPENDENT_AMBULATORY_CARE_PROVIDER_SITE_OTHER): Payer: Self-pay | Admitting: Family Medicine

## 2020-02-18 VITALS — BP 115/70 | HR 67 | Temp 97.8°F | Ht 60.0 in | Wt 203.0 lb

## 2020-02-18 DIAGNOSIS — E1169 Type 2 diabetes mellitus with other specified complication: Secondary | ICD-10-CM

## 2020-02-18 DIAGNOSIS — Z9189 Other specified personal risk factors, not elsewhere classified: Secondary | ICD-10-CM

## 2020-02-18 DIAGNOSIS — Z6839 Body mass index (BMI) 39.0-39.9, adult: Secondary | ICD-10-CM | POA: Diagnosis not present

## 2020-02-18 DIAGNOSIS — E559 Vitamin D deficiency, unspecified: Secondary | ICD-10-CM | POA: Diagnosis not present

## 2020-02-25 ENCOUNTER — Other Ambulatory Visit (INDEPENDENT_AMBULATORY_CARE_PROVIDER_SITE_OTHER): Payer: Self-pay | Admitting: Family Medicine

## 2020-02-25 DIAGNOSIS — E1169 Type 2 diabetes mellitus with other specified complication: Secondary | ICD-10-CM

## 2020-02-25 NOTE — Progress Notes (Signed)
Chief Complaint:   OBESITY Leah Gonzalez is here to discuss her progress with her obesity treatment plan along with follow-up of her obesity related diagnoses. Leah Gonzalez is on the Category 2 Plan + 100 calories and states she is following her eating plan approximately 80% of the time. Leah Gonzalez states she is walking for 15-20 minutes 1-2 times per week.  Today's visit was #: 5 Starting weight: 227 lbs Starting date: 11/08/2019 Today's weight: 203 lbs Today's date: 02/18/2020 Total lbs lost to date: 24 Total lbs lost since last in-office visit: 0  Interim History: Leah Gonzalez has done well maintaining her weight. She has been able to avoid some temptations but she is starting to substitute, and it appears her protein intake is drifting down.  Subjective:   1. Type 2 diabetes mellitus with other specified complication, without long-term current use of insulin (HCC) Leah Gonzalez's last A1c was 6.5. She has been doing well with diet and she is tolerating metformin well overall. She denies signs of hypoglycemia.  2. Vitamin D deficiency Leah Gonzalez is stable on Vit D, but her level is not yet at goal. She requests a refill today.  3. At risk for heart disease Leah Gonzalez is at a higher than average risk for cardiovascular disease due to obesity.   Assessment/Plan:   1. Type 2 diabetes mellitus with other specified complication, without long-term current use of insulin (HCC) Good blood sugar control is important to decrease the likelihood of diabetic complications such as nephropathy, neuropathy, limb loss, blindness, coronary artery disease, and death. Intensive lifestyle modification including diet, exercise and weight loss are the first line of treatment for diabetes. We will refill metformin 500 mg q AM #30 for 1 month. Shagun will continue with diet, exercise, and weight loss and will follow closely.  2. Vitamin D deficiency Low Vitamin D level contributes to fatigue and are associated with obesity,  breast, and colon cancer. We will refill prescription Vitamin D 50,000 IU every week #4 for 1 month. Jalayna will follow-up for routine testing of Vitamin D, at least 2-3 times per year to avoid over-replacement.  3. At risk for heart disease Leah Gonzalez was given approximately 15 minutes of coronary artery disease prevention counseling today. She is 54 y.o. female and has risk factors for heart disease including obesity. We discussed intensive lifestyle modifications today with an emphasis on specific weight loss instructions and strategies.   Repetitive spaced learning was employed today to elicit superior memory formation and behavioral change.  4. Class 2 severe obesity with serious comorbidity and body mass index (BMI) of 39.0 to 39.9 in adult, unspecified obesity type (HCC) Leah Gonzalez is currently in the action stage of change. As such, her goal is to continue with weight loss efforts. She has agreed to the Category 2 Plan + 100 calories and lean meat equivalents.   Exercise goals: As is.  Behavioral modification strategies: increasing lean protein intake, dealing with family or coworker sabotage and holiday eating strategies .  Leah Gonzalez has agreed to follow-up with our clinic in 2 weeks. She was informed of the importance of frequent follow-up visits to maximize her success with intensive lifestyle modifications for her multiple health conditions.   Objective:   Blood pressure 115/70, pulse 67, temperature 97.8 F (36.6 C), height 5' (1.524 m), weight 203 lb (92.1 kg), SpO2 97 %. Body mass index is 39.65 kg/m.  General: Cooperative, alert, well developed, in no acute distress. HEENT: Conjunctivae and lids unremarkable. Cardiovascular: Regular rhythm.  Lungs: Normal  work of breathing. Neurologic: No focal deficits.   Lab Results  Component Value Date   CREATININE 0.67 11/08/2019   BUN 12 11/08/2019   NA 141 11/08/2019   K 4.6 11/08/2019   CL 101 11/08/2019   CO2 26 11/08/2019    Lab Results  Component Value Date   ALT 23 11/08/2019   AST 21 11/08/2019   ALKPHOS 92 11/08/2019   BILITOT <0.2 11/08/2019   Lab Results  Component Value Date   HGBA1C 6.5 (H) 11/08/2019   Lab Results  Component Value Date   INSULIN 12.3 11/08/2019   Lab Results  Component Value Date   TSH 1.760 11/08/2019   Lab Results  Component Value Date   CHOL 242 (H) 11/08/2019   HDL 46 11/08/2019   LDLCALC 175 (H) 11/08/2019   TRIG 118 11/08/2019   Lab Results  Component Value Date   WBC 6.8 11/08/2019   HGB 12.2 11/08/2019   HCT 39.0 11/08/2019   MCV 81 11/08/2019   PLT 326 11/08/2019   No results found for: IRON, TIBC, FERRITIN  Attestation Statements:   Reviewed by clinician on day of visit: allergies, medications, problem list, medical history, surgical history, family history, social history, and previous encounter notes.   I, Burt Knack, am acting as transcriptionist for Quillian Quince, MD.  I have reviewed the above documentation for accuracy and completeness, and I agree with the above. -  Quillian Quince, MD

## 2020-02-25 NOTE — Telephone Encounter (Signed)
Refill form completed and given to MD for approval/denial. °

## 2020-02-26 ENCOUNTER — Other Ambulatory Visit (INDEPENDENT_AMBULATORY_CARE_PROVIDER_SITE_OTHER): Payer: Self-pay

## 2020-02-26 DIAGNOSIS — E559 Vitamin D deficiency, unspecified: Secondary | ICD-10-CM

## 2020-02-26 DIAGNOSIS — E1169 Type 2 diabetes mellitus with other specified complication: Secondary | ICD-10-CM

## 2020-02-26 MED ORDER — VITAMIN D (ERGOCALCIFEROL) 1.25 MG (50000 UNIT) PO CAPS: 50000.0000 [IU] | ORAL_CAPSULE | ORAL | 0 refills | Status: DC

## 2020-02-26 MED ORDER — METFORMIN HCL 500 MG PO TABS: 500.0000 mg | ORAL_TABLET | Freq: Every day | ORAL | 0 refills | Status: DC

## 2020-03-03 ENCOUNTER — Other Ambulatory Visit: Payer: Self-pay

## 2020-03-03 ENCOUNTER — Ambulatory Visit (INDEPENDENT_AMBULATORY_CARE_PROVIDER_SITE_OTHER): Payer: BC Managed Care – PPO | Admitting: Family Medicine

## 2020-03-03 ENCOUNTER — Encounter (INDEPENDENT_AMBULATORY_CARE_PROVIDER_SITE_OTHER): Payer: Self-pay | Admitting: Family Medicine

## 2020-03-03 VITALS — BP 99/64 | HR 74 | Temp 97.7°F | Ht 60.0 in | Wt 205.0 lb

## 2020-03-03 DIAGNOSIS — E559 Vitamin D deficiency, unspecified: Secondary | ICD-10-CM

## 2020-03-03 DIAGNOSIS — E1169 Type 2 diabetes mellitus with other specified complication: Secondary | ICD-10-CM | POA: Diagnosis not present

## 2020-03-03 DIAGNOSIS — E7849 Other hyperlipidemia: Secondary | ICD-10-CM | POA: Diagnosis not present

## 2020-03-03 DIAGNOSIS — Z9189 Other specified personal risk factors, not elsewhere classified: Secondary | ICD-10-CM

## 2020-03-03 DIAGNOSIS — Z6841 Body Mass Index (BMI) 40.0 and over, adult: Secondary | ICD-10-CM

## 2020-03-03 MED ORDER — VITAMIN D (ERGOCALCIFEROL) 1.25 MG (50000 UNIT) PO CAPS: 50000.0000 [IU] | ORAL_CAPSULE | ORAL | 0 refills | Status: DC

## 2020-03-03 MED ORDER — METFORMIN HCL 500 MG PO TABS: 500.0000 mg | ORAL_TABLET | Freq: Every day | ORAL | 0 refills | Status: DC

## 2020-03-04 NOTE — Progress Notes (Signed)
Chief Complaint:   OBESITY Leah Gonzalez is here to discuss her progress with her obesity treatment plan along with follow-up of her obesity related diagnoses. Heide is on the Category 2 Plan + 100 calories with lean meat equivalents and states she is following her eating plan approximately 100% of the time. Shaday states she is walking for 15 minutes 3 times per week.  Today's visit was #: 6 Starting weight: 227 lbs Starting date: 11/08/2019 Today's weight: 205 lbs Today's date: 03/03/2020 Total lbs lost to date: 22 Total lbs lost since last in-office visit: 0  Interim History: Leah Gonzalez is retaining some water weight today. She has done well overall with her eating plan, but she has made some deviations for dinner which are not meeting all of her goals.  Subjective:   1. Type 2 diabetes mellitus with other specified complication, without long-term current use of insulin (HCC) Leah Gonzalez's last A1c was 6.5, and she is doing well with diet and weight loss. She is tolerating metformin well, and she denies nausea or vomiting. I discussed labs with the patient today.  2. Vitamin D deficiency Leah Gonzalez's Vit D level is not yet at goal, and she denies side effects. She is due for labs soon. I discussed labs with the patient today.  3. Other hyperlipidemia Leah Gonzalez's last LDL was very elevated at 175. She is not on statin, and she is attempting to improve with diet and weight loss first. I discussed labs with the patient today.  4. At risk for heart disease Leah Gonzalez is at a higher than average risk for cardiovascular disease due to obesity.   Assessment/Plan:   1. Type 2 diabetes mellitus with other specified complication, without long-term current use of insulin (HCC) Good blood sugar control is important to decrease the likelihood of diabetic complications such as nephropathy, neuropathy, limb loss, blindness, coronary artery disease, and death. Intensive lifestyle modification including diet,  exercise and weight loss are the first line of treatment for diabetes. Marshay will continue her medications, and we will refill metformin for 1 month. We will recheck labs at her next visit.  - metFORMIN (GLUCOPHAGE) 500 MG tablet; Take 1 tablet (500 mg total) by mouth daily with breakfast.  Dispense: 30 tablet; Refill: 0  2. Vitamin D deficiency Low Vitamin D level contributes to fatigue and are associated with obesity, breast, and colon cancer. We will refill prescription Vitamin D for 1 month, and we will recheck labs at her next visit. [place]ita will follow-up for routine testing of Vitamin D, at least 2-3 times per year to avoid over-replacement.  - Vitamin D, Ergocalciferol, (DRISDOL) 1.25 MG (50000 UNIT) CAPS capsule; Take 1 capsule (50,000 Units total) by mouth every 7 (seven) days.  Dispense: 4 capsule; Refill: 0  3. Other hyperlipidemia Cardiovascular risk and specific lipid/LDL goals reviewed. We discussed several lifestyle modifications today and [place] will continue to work on diet, exercise and weight loss efforts. We will recheck labs at her next visit. Orders and follow up as documented in patient record.   4. At risk for heart disease Leah Gonzalez was given approximately 15 minutes of coronary artery disease prevention counseling today. She is 54 y.o. female and has risk factors for heart disease including obesity. We discussed intensive lifestyle modifications today with an emphasis on specific weight loss instructions and strategies.   Repetitive spaced learning was employed today to elicit superior memory formation and behavioral change.  5. Class 3 severe obesity with serious comorbidity and body mass  index (BMI) of 40.0 to 44.9 in adult, unspecified obesity type (HCC) Leah Gonzalez is currently in the action stage of change. As such, her goal is to continue with weight loss efforts. She has agreed to the Category 2 Plan and keeping a food journal and adhering to recommended goals of  400-550 calories and 40+ grams of protein at supper daily.   We will recheck fasting labs at her next visit.  Exercise goals: As is.  Behavioral modification strategies: increasing lean protein intake, meal planning and cooking strategies, celebration eating strategies and keeping a strict food journal.  Leah Gonzalez has agreed to follow-up with our clinic in 2 to 3 weeks. She was informed of the importance of frequent follow-up visits to maximize her success with intensive lifestyle modifications for her multiple health conditions.   Objective:   Blood pressure 99/64, pulse 74, temperature 97.7 F (36.5 C), height 5' (1.524 m), weight 205 lb (93 kg), SpO2 97 %. Body mass index is 40.04 kg/m.  General: Cooperative, alert, well developed, in no acute distress. HEENT: Conjunctivae and lids unremarkable. Cardiovascular: Regular rhythm.  Lungs: Normal work of breathing. Neurologic: No focal deficits.   Lab Results  Component Value Date   CREATININE 0.67 11/08/2019   BUN 12 11/08/2019   NA 141 11/08/2019   K 4.6 11/08/2019   CL 101 11/08/2019   CO2 26 11/08/2019   Lab Results  Component Value Date   ALT 23 11/08/2019   AST 21 11/08/2019   ALKPHOS 92 11/08/2019   BILITOT <0.2 11/08/2019   Lab Results  Component Value Date   HGBA1C 6.5 (H) 11/08/2019   Lab Results  Component Value Date   INSULIN 12.3 11/08/2019   Lab Results  Component Value Date   TSH 1.760 11/08/2019   Lab Results  Component Value Date   CHOL 242 (H) 11/08/2019   HDL 46 11/08/2019   LDLCALC 175 (H) 11/08/2019   TRIG 118 11/08/2019   Lab Results  Component Value Date   WBC 6.8 11/08/2019   HGB 12.2 11/08/2019   HCT 39.0 11/08/2019   MCV 81 11/08/2019   PLT 326 11/08/2019   No results found for: IRON, TIBC, FERRITIN  Attestation Statements:   Reviewed by clinician on day of visit: allergies, medications, problem list, medical history, surgical history, family history, social history, and  previous encounter notes.   I, Burt Knack, am acting as transcriptionist for Quillian Quince, MD.  I have reviewed the above documentation for accuracy and completeness, and I agree with the above. -

## 2020-03-07 ENCOUNTER — Other Ambulatory Visit (INDEPENDENT_AMBULATORY_CARE_PROVIDER_SITE_OTHER): Payer: Self-pay | Admitting: Family Medicine

## 2020-03-07 DIAGNOSIS — E559 Vitamin D deficiency, unspecified: Secondary | ICD-10-CM

## 2020-03-17 ENCOUNTER — Other Ambulatory Visit: Payer: Self-pay

## 2020-03-17 ENCOUNTER — Ambulatory Visit (INDEPENDENT_AMBULATORY_CARE_PROVIDER_SITE_OTHER): Payer: BC Managed Care – PPO | Admitting: Family Medicine

## 2020-03-17 ENCOUNTER — Encounter (INDEPENDENT_AMBULATORY_CARE_PROVIDER_SITE_OTHER): Payer: Self-pay | Admitting: Family Medicine

## 2020-03-17 VITALS — BP 107/72 | HR 62 | Temp 97.5°F | Ht 60.0 in | Wt 203.0 lb

## 2020-03-17 DIAGNOSIS — E559 Vitamin D deficiency, unspecified: Secondary | ICD-10-CM | POA: Diagnosis not present

## 2020-03-17 DIAGNOSIS — E7849 Other hyperlipidemia: Secondary | ICD-10-CM

## 2020-03-17 DIAGNOSIS — Z9189 Other specified personal risk factors, not elsewhere classified: Secondary | ICD-10-CM

## 2020-03-17 DIAGNOSIS — Z6839 Body mass index (BMI) 39.0-39.9, adult: Secondary | ICD-10-CM

## 2020-03-17 DIAGNOSIS — E1169 Type 2 diabetes mellitus with other specified complication: Secondary | ICD-10-CM | POA: Diagnosis not present

## 2020-03-18 LAB — COMPREHENSIVE METABOLIC PANEL
ALT: 14 IU/L (ref 0–32)
AST: 18 IU/L (ref 0–40)
Albumin/Globulin Ratio: 1.3 (ref 1.2–2.2)
Albumin: 4.3 g/dL (ref 3.8–4.9)
Alkaline Phosphatase: 83 IU/L (ref 44–121)
BUN/Creatinine Ratio: 16 (ref 9–23)
BUN: 11 mg/dL (ref 6–24)
Bilirubin Total: 0.2 mg/dL (ref 0.0–1.2)
CO2: 27 mmol/L (ref 20–29)
Calcium: 9.9 mg/dL (ref 8.7–10.2)
Chloride: 99 mmol/L (ref 96–106)
Creatinine, Ser: 0.68 mg/dL (ref 0.57–1.00)
GFR calc Af Amer: 116 mL/min/{1.73_m2} (ref >59)
GFR calc non Af Amer: 101 mL/min/{1.73_m2} (ref >59)
Globulin, Total: 3.4 g/dL (ref 1.5–4.5)
Glucose: 72 mg/dL (ref 65–99)
Potassium: 4 mmol/L (ref 3.5–5.2)
Sodium: 138 mmol/L (ref 134–144)
Total Protein: 7.7 g/dL (ref 6.0–8.5)

## 2020-03-18 LAB — INSULIN, RANDOM: INSULIN: 7.5 u[IU]/mL (ref 2.6–24.9)

## 2020-03-18 LAB — LIPID PANEL WITH LDL/HDL RATIO
Cholesterol, Total: 236 mg/dL — ABNORMAL HIGH (ref 100–199)
HDL: 44 mg/dL (ref >39)
LDL Chol Calc (NIH): 177 mg/dL — ABNORMAL HIGH (ref 0–99)
LDL/HDL Ratio: 4 ratio — ABNORMAL HIGH (ref 0.0–3.2)
Triglycerides: 85 mg/dL (ref 0–149)
VLDL Cholesterol Cal: 15 mg/dL (ref 5–40)

## 2020-03-18 LAB — HEMOGLOBIN A1C
Est. average glucose Bld gHb Est-mCnc: 117 mg/dL
Hgb A1c MFr Bld: 5.7 % — ABNORMAL HIGH (ref 4.8–5.6)

## 2020-03-18 LAB — VITAMIN D 25 HYDROXY (VIT D DEFICIENCY, FRACTURES): Vit D, 25-Hydroxy: 51.1 ng/mL (ref 30.0–100.0)

## 2020-03-18 NOTE — Progress Notes (Signed)
Chief Complaint:   OBESITY Leah Gonzalez is here to discuss her progress with her obesity treatment plan along with follow-up of her obesity related diagnoses. Leah Gonzalez is on the Category 2 Plan and keeping a food journal and adhering to recommended goals of 400-550 calories and 40+ grams of protein at supper daily and states she is following her eating plan approximately 85% of the time. Leah Gonzalez states she is walking for 10 minutes 5 times per week.  Today's visit was #: 7 Starting weight: 227 lbs Starting date: 11/08/2019 Today's weight: 203 lbs Today's date: 03/17/2020 Total lbs lost to date: 24 Total lbs lost since last in-office visit: 2  Interim History: Leah Gonzalez continues to do well with weight loss on her Category 2 plan with dinner journaling. She is working on Clinical cytogeneticist protein and she is doing better overall with her choices . She is open to discuss holiday eating strategies for Thanksgiving.  Subjective:   1. Type 2 diabetes mellitus with other specified complication, without long-term current use of insulin (HCC) Leah Gonzalez is working on decreasing simple carbohydrates and does not notes any issues with feeling week or shaky. She is ready for labs today.  2. Vitamin D deficiency Leah Gonzalez is due for labs today, and she is stable on Vit D.  3. Other hyperlipidemia Leah Gonzalez has a history of elevated LDL and triglycerides. She is working on weight loss and exercise, and she is doing well. She is due for labs today.  4. At risk for activity intolerance Leah Gonzalez is at risk for exercise intolerance due to continued weight loss.  Assessment/Plan:   1. Type 2 diabetes mellitus with other specified complication, without long-term current use of insulin (HCC) Good blood sugar control is important to decrease the likelihood of diabetic complications such as nephropathy, neuropathy, limb loss, blindness, coronary artery disease, and death. Intensive lifestyle modification including  diet, exercise and weight loss are the first line of treatment for diabetes. We will check labs today. Leah Gonzalez will continue her diet and exercise and will follow up as directed.  - Comprehensive metabolic panel - Hemoglobin A1c - Insulin, random  2. Vitamin D deficiency Low Vitamin D level contributes to fatigue and are associated with obesity, breast, and colon cancer. We will check labs today. Leah Gonzalez will follow-up for routine testing of Vitamin D, at least 2-3 times per year to avoid over-replacement.  - VITAMIN D 25 Hydroxy (Vit-D Deficiency, Fractures)  3. Other hyperlipidemia Cardiovascular risk and specific lipid/LDL goals reviewed. We discussed several lifestyle modifications today and Leah Gonzalez will continue to work on diet, exercise and weight loss efforts. We will check labs today. Orders and follow up as documented in patient record.   - Lipid Panel With LDL/HDL Ratio  4. At risk for activity intolerance Leah Gonzalez was given approximately 15 minutes of exercise intolerance counseling today. She is 54 y.o. female and has risk factors exercise intolerance including obesity. We discussed intensive lifestyle modifications today with an emphasis on specific weight loss instructions and strategies. Leah Gonzalez will slowly increase activity over time.  Leah spaced learning was employed today to elicit superior memory formation and behavioral change.  5. Class 2 severe obesity with serious comorbidity and body mass index (BMI) of 39.0 to 39.9 in adult, unspecified obesity type (HCC) Leah Gonzalez is currently in the action stage of change. As such, her goal is to continue with weight loss efforts. She has agreed to the Category 2 Plan and keeping a food journal and adhering to  recommended goals of 400-550 calories and 40 grams of protein at supper daily.   Exercise goals: As is.  Behavioral modification strategies: increasing lean protein intake, meal planning and cooking strategies and  holiday eating strategies .  Leah Gonzalez has agreed to follow-up with our clinic in 3 weeks. She was informed of the importance of frequent follow-up visits to maximize her success with intensive lifestyle modifications for her multiple health conditions.   Leah Gonzalez was informed we would discuss her lab results at her next visit unless there is a critical issue that needs to be addressed sooner. Leah Gonzalez agreed to keep her next visit at the agreed upon time to discuss these results.  Objective:   Blood pressure 107/72, pulse 62, temperature (!) 97.5 F (36.4 C), height 5' (1.524 m), weight 203 lb (92.1 kg), SpO2 99 %. Body mass index is 39.65 kg/m.  General: Cooperative, alert, well developed, in no acute distress. HEENT: Conjunctivae and lids unremarkable. Cardiovascular: Regular rhythm.  Lungs: Normal work of breathing. Neurologic: No focal deficits.   Lab Results  Component Value Date   CREATININE 0.68 03/17/2020   BUN 11 03/17/2020   NA 138 03/17/2020   K 4.0 03/17/2020   CL 99 03/17/2020   CO2 27 03/17/2020   Lab Results  Component Value Date   ALT 14 03/17/2020   AST 18 03/17/2020   ALKPHOS 83 03/17/2020   BILITOT 0.2 03/17/2020   Lab Results  Component Value Date   HGBA1C 5.7 (H) 03/17/2020   HGBA1C 6.5 (H) 11/08/2019   Lab Results  Component Value Date   INSULIN 7.5 03/17/2020   INSULIN 12.3 11/08/2019   Lab Results  Component Value Date   TSH 1.760 11/08/2019   Lab Results  Component Value Date   CHOL 236 (H) 03/17/2020   HDL 44 03/17/2020   LDLCALC 177 (H) 03/17/2020   TRIG 85 03/17/2020   Lab Results  Component Value Date   WBC 6.8 11/08/2019   HGB 12.2 11/08/2019   HCT 39.0 11/08/2019   MCV 81 11/08/2019   PLT 326 11/08/2019   No results found for: IRON, TIBC, FERRITIN  Attestation Statements:   Reviewed by clinician on day of visit: allergies, medications, problem list, medical history, surgical history, family history, social history, and  previous encounter notes.   I, Burt Knack, am acting as transcriptionist for Quillian Quince, MD.  I have reviewed the above documentation for accuracy and completeness, and I agree with the above. -  Quillian Quince, MD

## 2020-03-23 ENCOUNTER — Other Ambulatory Visit (INDEPENDENT_AMBULATORY_CARE_PROVIDER_SITE_OTHER): Payer: Self-pay | Admitting: Family Medicine

## 2020-03-23 DIAGNOSIS — E559 Vitamin D deficiency, unspecified: Secondary | ICD-10-CM

## 2020-03-24 ENCOUNTER — Encounter (INDEPENDENT_AMBULATORY_CARE_PROVIDER_SITE_OTHER): Payer: Self-pay

## 2020-03-24 NOTE — Telephone Encounter (Signed)
MyChart message sent to pt to find out if they have enough medication to get them through until next appt.   

## 2020-03-28 ENCOUNTER — Other Ambulatory Visit (INDEPENDENT_AMBULATORY_CARE_PROVIDER_SITE_OTHER): Payer: Self-pay | Admitting: Family Medicine

## 2020-03-28 DIAGNOSIS — E1169 Type 2 diabetes mellitus with other specified complication: Secondary | ICD-10-CM

## 2020-03-31 ENCOUNTER — Encounter (INDEPENDENT_AMBULATORY_CARE_PROVIDER_SITE_OTHER): Payer: Self-pay

## 2020-03-31 NOTE — Telephone Encounter (Signed)
MyChart message sent to pt to find out if they have enough medication to get them through until next appt.   

## 2020-03-31 NOTE — Telephone Encounter (Signed)
Last OV with Dr. Beasley 

## 2020-04-07 ENCOUNTER — Encounter (INDEPENDENT_AMBULATORY_CARE_PROVIDER_SITE_OTHER): Payer: Self-pay | Admitting: Family Medicine

## 2020-04-07 ENCOUNTER — Other Ambulatory Visit: Payer: Self-pay

## 2020-04-07 ENCOUNTER — Ambulatory Visit (INDEPENDENT_AMBULATORY_CARE_PROVIDER_SITE_OTHER): Payer: BC Managed Care – PPO | Admitting: Family Medicine

## 2020-04-07 VITALS — BP 105/65 | HR 75 | Temp 98.0°F | Ht 60.0 in | Wt 200.0 lb

## 2020-04-07 DIAGNOSIS — E7849 Other hyperlipidemia: Secondary | ICD-10-CM | POA: Diagnosis not present

## 2020-04-07 DIAGNOSIS — E1169 Type 2 diabetes mellitus with other specified complication: Secondary | ICD-10-CM

## 2020-04-07 DIAGNOSIS — E559 Vitamin D deficiency, unspecified: Secondary | ICD-10-CM | POA: Diagnosis not present

## 2020-04-07 DIAGNOSIS — Z6839 Body mass index (BMI) 39.0-39.9, adult: Secondary | ICD-10-CM

## 2020-04-07 DIAGNOSIS — Z9189 Other specified personal risk factors, not elsewhere classified: Secondary | ICD-10-CM

## 2020-04-07 MED ORDER — VITAMIN D (ERGOCALCIFEROL) 1.25 MG (50000 UNIT) PO CAPS: 50000.0000 [IU] | ORAL_CAPSULE | ORAL | 0 refills | Status: DC

## 2020-04-07 MED ORDER — ATORVASTATIN CALCIUM 10 MG PO TABS: 10.0000 mg | ORAL_TABLET | Freq: Every day | ORAL | 0 refills | Status: DC

## 2020-04-07 MED ORDER — METFORMIN HCL 500 MG PO TABS: 500.0000 mg | ORAL_TABLET | Freq: Every day | ORAL | 0 refills | Status: DC

## 2020-04-08 NOTE — Progress Notes (Signed)
Chief Complaint:   OBESITY Leah Gonzalez is here to discuss her progress with her obesity treatment plan along with follow-up of her obesity related diagnoses. Leah Gonzalez is on the Category 2 Plan and keeping a food journal and adhering to recommended goals of 400-550 calories and 40 grams of protein at supper daily and states she is following her eating plan approximately 85% of the time. Leah Gonzalez states she is dancing for 10 minutes 7 times per week.  Today's visit was #: 8 Starting weight: 227 lbs Starting date: 11/08/2019 Today's weight: 200 lbs Today's date: 04/07/2020 Total lbs lost to date: 27 Total lbs lost since last in-office visit: 3  Interim History: Leah Gonzalez has done very well with weight loss even over Thanksgiving. She was able to portion control and make smart choices, and avoid leftovers. She feels her hunger is mostly controlled.  Subjective:   1. Vitamin D deficiency Leah Gonzalez is stable on Vit D, and her level is now at goal.  2. Type 2 diabetes mellitus with other specified complication, without long-term current use of insulin (HCC) Leah Gonzalez's A1c is very well controlled with diet and weight loss, and with a touch of metformin. She notes decreased polyphagia.  3. Other hyperlipidemia Leah Gonzalez's LLDL is still elevated in the 170's. She denies chest pain. She is doing very well with diet and weight loss.  4. At risk for dehydration Leah Gonzalez is at risk for dehydration due to weight loss.  Assessment/Plan:   1. Vitamin D deficiency Low Vitamin D level contributes to fatigue and are associated with obesity, breast, and colon cancer. We will refill prescription Vitamin D for 1 month. Leah Gonzalez will follow-up for routine testing of Vitamin D, at least 2-3 times per year to avoid over-replacement.  - Vitamin D, Ergocalciferol, (DRISDOL) 1.25 MG (50000 UNIT) CAPS capsule; Take 1 capsule (50,000 Units total) by mouth every 7 (seven) days.  Dispense: 4 capsule; Refill: 0  2. Type 2  diabetes mellitus with other specified complication, without long-term current use of insulin (HCC) Good blood sugar control is important to decrease the likelihood of diabetic complications such as nephropathy, neuropathy, limb loss, blindness, coronary artery disease, and death. Intensive lifestyle modification including diet, exercise and weight loss are the first line of treatment for diabetes. Monda will continue her medications, diet, and weight loss, and we will refill metformin for 1 month.  - metFORMIN (GLUCOPHAGE) 500 MG tablet; Take 1 tablet (500 mg total) by mouth daily with breakfast.  Dispense: 30 tablet; Refill: 0  3. Other hyperlipidemia Cardiovascular risk and specific lipid/LDL goals reviewed. We discussed several lifestyle modifications today. Leah Gonzalez agreed to start Lipitor 10 mg qhs with no refills, and she will follow up with her primary care physician next month. She will continue to work on diet, exercise and weight loss efforts. Orders and follow up as documented in patient record.   - atorvastatin (LIPITOR) 10 MG tablet; Take 1 tablet (10 mg total) by mouth daily.  Dispense: 30 tablet; Refill: 0  4. At risk for dehydration Leah Gonzalez was given approximately 15 minutes dehydration prevention counseling today. Leah Gonzalez is at risk for dehydration due to weight loss and current medication(s). She was encouraged to hydrate and monitor fluid status to avoid dehydration as well as weight loss plateaus.   5. Class 2 severe obesity with serious comorbidity and body mass index (BMI) of 39.0 to 39.9 in adult, unspecified obesity type (HCC) Leah Gonzalez is currently in the action stage of change. As such, her  goal is to continue with weight loss efforts. She has agreed to the Category 2 Plan and keeping a food journal and adhering to recommended goals of 400-550 calories and 40+ grams of protein at supper daily.   Exercise goals: As is.  Behavioral modification strategies: increasing water  intake and meal planning and cooking strategies.  Leah Gonzalez has agreed to follow-up with our clinic in 4 weeks. She was informed of the importance of frequent follow-up visits to maximize her success with intensive lifestyle modifications for her multiple health conditions.   Objective:   Blood pressure 105/65, pulse 75, temperature 98 F (36.7 C), height 5' (1.524 m), weight 200 lb (90.7 kg), SpO2 100 %. Body mass index is 39.06 kg/m.  General: Cooperative, alert, well developed, in no acute distress. HEENT: Conjunctivae and lids unremarkable. Cardiovascular: Regular rhythm.  Lungs: Normal work of breathing. Neurologic: No focal deficits.   Lab Results  Component Value Date   CREATININE 0.68 03/17/2020   BUN 11 03/17/2020   NA 138 03/17/2020   K 4.0 03/17/2020   CL 99 03/17/2020   CO2 27 03/17/2020   Lab Results  Component Value Date   ALT 14 03/17/2020   AST 18 03/17/2020   ALKPHOS 83 03/17/2020   BILITOT 0.2 03/17/2020   Lab Results  Component Value Date   HGBA1C 5.7 (H) 03/17/2020   HGBA1C 6.5 (H) 11/08/2019   Lab Results  Component Value Date   INSULIN 7.5 03/17/2020   INSULIN 12.3 11/08/2019   Lab Results  Component Value Date   TSH 1.760 11/08/2019   Lab Results  Component Value Date   CHOL 236 (H) 03/17/2020   HDL 44 03/17/2020   LDLCALC 177 (H) 03/17/2020   TRIG 85 03/17/2020   Lab Results  Component Value Date   WBC 6.8 11/08/2019   HGB 12.2 11/08/2019   HCT 39.0 11/08/2019   MCV 81 11/08/2019   PLT 326 11/08/2019   No results found for: IRON, TIBC, FERRITIN  Attestation Statements:   Reviewed by clinician on day of visit: allergies, medications, problem list, medical history, surgical history, family history, social history, and previous encounter notes.   I, Burt Knack, am acting as transcriptionist for Quillian Quince, MD.  I have reviewed the above documentation for accuracy and completeness, and I agree with the above. -  Quillian Quince, MD

## 2020-04-15 ENCOUNTER — Other Ambulatory Visit (INDEPENDENT_AMBULATORY_CARE_PROVIDER_SITE_OTHER): Payer: Self-pay | Admitting: Family Medicine

## 2020-04-15 DIAGNOSIS — E1169 Type 2 diabetes mellitus with other specified complication: Secondary | ICD-10-CM

## 2020-04-24 ENCOUNTER — Other Ambulatory Visit (INDEPENDENT_AMBULATORY_CARE_PROVIDER_SITE_OTHER): Payer: Self-pay | Admitting: Family Medicine

## 2020-04-24 DIAGNOSIS — E559 Vitamin D deficiency, unspecified: Secondary | ICD-10-CM

## 2020-04-29 ENCOUNTER — Other Ambulatory Visit (INDEPENDENT_AMBULATORY_CARE_PROVIDER_SITE_OTHER): Payer: Self-pay | Admitting: Family Medicine

## 2020-04-29 DIAGNOSIS — E7849 Other hyperlipidemia: Secondary | ICD-10-CM

## 2020-05-05 ENCOUNTER — Ambulatory Visit (INDEPENDENT_AMBULATORY_CARE_PROVIDER_SITE_OTHER): Payer: BC Managed Care – PPO | Admitting: Family Medicine

## 2020-05-05 ENCOUNTER — Other Ambulatory Visit: Payer: Self-pay

## 2020-05-05 ENCOUNTER — Encounter (INDEPENDENT_AMBULATORY_CARE_PROVIDER_SITE_OTHER): Payer: Self-pay | Admitting: Family Medicine

## 2020-05-05 VITALS — BP 103/68 | HR 76 | Temp 98.4°F | Ht 60.0 in | Wt 202.0 lb

## 2020-05-05 DIAGNOSIS — Z9189 Other specified personal risk factors, not elsewhere classified: Secondary | ICD-10-CM

## 2020-05-05 DIAGNOSIS — E1169 Type 2 diabetes mellitus with other specified complication: Secondary | ICD-10-CM | POA: Diagnosis not present

## 2020-05-05 DIAGNOSIS — E7849 Other hyperlipidemia: Secondary | ICD-10-CM

## 2020-05-05 DIAGNOSIS — E559 Vitamin D deficiency, unspecified: Secondary | ICD-10-CM | POA: Diagnosis not present

## 2020-05-05 DIAGNOSIS — Z6839 Body mass index (BMI) 39.0-39.9, adult: Secondary | ICD-10-CM

## 2020-05-05 MED ORDER — METFORMIN HCL 500 MG PO TABS: 500.0000 mg | ORAL_TABLET | Freq: Every day | ORAL | 0 refills | Status: DC

## 2020-05-05 MED ORDER — VITAMIN D (ERGOCALCIFEROL) 1.25 MG (50000 UNIT) PO CAPS: 50000.0000 [IU] | ORAL_CAPSULE | ORAL | 0 refills | Status: DC

## 2020-05-07 NOTE — Progress Notes (Signed)
Chief Complaint:   OBESITY Leah Gonzalez is here to discuss her progress with her obesity treatment plan along with follow-up of her obesity related diagnoses. Leah Gonzalez is on the Category 2 Plan and keeping a food journal and adhering to recommended goals of 400-550 calories and 40+ grams of protein at supper daily and states she is following her eating plan approximately 75% of the time. Leah Gonzalez states she is dancing for 15 minutes 3-4 times per week.  Today's visit was #: 9 Starting weight: 227 lbs Starting date: 11/08/2019 Today's weight: 202 lbs Today's date: 05/05/2020 Total lbs lost to date: 25 Total lbs lost since last in-office visit: 0  Interim History: Leah Gonzalez has done well with minimizing holiday eating. She is retaining a bit of fluid today, but overall she did well with portion control and making smarter choices. She is ready to get back on track and would like to discuss other eating plan options.  Subjective:   1. Type 2 diabetes mellitus with other specified complication, without long-term current use of insulin (HCC) Leah Gonzalez is doing very well with metformin and decreasing simple carbohydrates. She denies nausea or vomiting.  2. Vitamin D deficiency Leah Gonzalez is on Vit D and her level is not yet at goal, but is improving. She requests a refill today.  3. Other hyperlipidemia Leah Gonzalez tried to take her Lipitor in the evenings but frequently forgets, so she changed back to the daytime. She is ready to go back to her weight loss plan.  4. At risk for heart disease Leah Gonzalez is at a higher than average risk for cardiovascular disease due to obesity.   Assessment/Plan:   1. Type 2 diabetes mellitus with other specified complication, without long-term current use of insulin (HCC) Good blood sugar control is important to decrease the likelihood of diabetic complications such as nephropathy, neuropathy, limb loss, blindness, coronary artery disease, and death. Intensive lifestyle  modification including diet, exercise and weight loss are the first line of treatment for diabetes. Leah Gonzalez will continue her diet and weight loss, and we will refill metformin for 1 month.  - metFORMIN (GLUCOPHAGE) 500 MG tablet; Take 1 tablet (500 mg total) by mouth daily with breakfast.  Dispense: 30 tablet; Refill: 0  2. Vitamin D deficiency Low Vitamin D level contributes to fatigue and are associated with obesity, breast, and colon cancer. We will refill prescription Vitamin D for 1 month. Leah Gonzalez will follow-up for routine testing of Vitamin D, at least 2-3 times per year to avoid over-replacement.  - Vitamin D, Ergocalciferol, (DRISDOL) 1.25 MG (50000 UNIT) CAPS capsule; Take 1 capsule (50,000 Units total) by mouth every 7 (seven) days.  Dispense: 4 capsule; Refill: 0  3. Other hyperlipidemia Cardiovascular risk and specific lipid/LDL goals reviewed. We discussed several lifestyle modifications today. Leah Gonzalez is to continue Lipitor as is, and will continue to work on diet, exercise and weight loss efforts. We will recheck labs in 1-2 months. Orders and follow up as documented in patient record.   4. At risk for heart disease Leah Gonzalez was given approximately 15 minutes of coronary artery disease prevention counseling today. She is 55 y.o. female and has risk factors for heart disease including obesity. We discussed intensive lifestyle modifications today with an emphasis on specific weight loss instructions and strategies.   Repetitive spaced learning was employed today to elicit superior memory formation and behavioral change.  5. Class 2 severe obesity with serious comorbidity and body mass index (BMI) of 39.0 to 39.9 in  adult, unspecified obesity type (HCC) Leah Gonzalez is currently in the action stage of change. As such, her goal is to continue with weight loss efforts. She has agreed to the Category 2 Plan or the Pescatarian Plan.   Exercise goals: As is.  Behavioral modification  strategies: increasing lean protein intake and no skipping meals.  Leah Gonzalez has agreed to follow-up with our clinic in 3 weeks. She was informed of the importance of frequent follow-up visits to maximize her success with intensive lifestyle modifications for her multiple health conditions.   Objective:   Blood pressure 103/68, pulse 76, temperature 98.4 F (36.9 C), height 5' (1.524 m), weight 202 lb (91.6 kg), SpO2 97 %. Body mass index is 39.45 kg/m.  General: Cooperative, alert, well developed, in no acute distress. HEENT: Conjunctivae and lids unremarkable. Cardiovascular: Regular rhythm.  Lungs: Normal work of breathing. Neurologic: No focal deficits.   Lab Results  Component Value Date   CREATININE 0.68 03/17/2020   BUN 11 03/17/2020   NA 138 03/17/2020   K 4.0 03/17/2020   CL 99 03/17/2020   CO2 27 03/17/2020   Lab Results  Component Value Date   ALT 14 03/17/2020   AST 18 03/17/2020   ALKPHOS 83 03/17/2020   BILITOT 0.2 03/17/2020   Lab Results  Component Value Date   HGBA1C 5.7 (H) 03/17/2020   HGBA1C 6.5 (H) 11/08/2019   Lab Results  Component Value Date   INSULIN 7.5 03/17/2020   INSULIN 12.3 11/08/2019   Lab Results  Component Value Date   TSH 1.760 11/08/2019   Lab Results  Component Value Date   CHOL 236 (H) 03/17/2020   HDL 44 03/17/2020   LDLCALC 177 (H) 03/17/2020   TRIG 85 03/17/2020   Lab Results  Component Value Date   WBC 6.8 11/08/2019   HGB 12.2 11/08/2019   HCT 39.0 11/08/2019   MCV 81 11/08/2019   PLT 326 11/08/2019   No results found for: IRON, TIBC, FERRITIN  Attestation Statements:   Reviewed by clinician on day of visit: allergies, medications, problem list, medical history, surgical history, family history, social history, and previous encounter notes.   I, Burt Knack, am acting as transcriptionist for Quillian Quince, MD.  I have reviewed the above documentation for accuracy and completeness, and I agree with the  above. -  Quillian Quince, MD

## 2020-05-15 ENCOUNTER — Other Ambulatory Visit (INDEPENDENT_AMBULATORY_CARE_PROVIDER_SITE_OTHER): Payer: Self-pay | Admitting: Family Medicine

## 2020-05-15 DIAGNOSIS — E7849 Other hyperlipidemia: Secondary | ICD-10-CM

## 2020-05-19 ENCOUNTER — Encounter (INDEPENDENT_AMBULATORY_CARE_PROVIDER_SITE_OTHER): Payer: Self-pay

## 2020-05-19 NOTE — Telephone Encounter (Signed)
Message sent to pt.

## 2020-05-21 ENCOUNTER — Encounter (INDEPENDENT_AMBULATORY_CARE_PROVIDER_SITE_OTHER): Payer: Self-pay

## 2020-05-22 ENCOUNTER — Other Ambulatory Visit: Payer: Self-pay

## 2020-05-22 ENCOUNTER — Encounter (INDEPENDENT_AMBULATORY_CARE_PROVIDER_SITE_OTHER): Payer: Self-pay | Admitting: Family Medicine

## 2020-05-22 ENCOUNTER — Telehealth (INDEPENDENT_AMBULATORY_CARE_PROVIDER_SITE_OTHER): Payer: BC Managed Care – PPO | Admitting: Family Medicine

## 2020-05-22 DIAGNOSIS — E559 Vitamin D deficiency, unspecified: Secondary | ICD-10-CM

## 2020-05-22 DIAGNOSIS — E1169 Type 2 diabetes mellitus with other specified complication: Secondary | ICD-10-CM

## 2020-05-22 DIAGNOSIS — Z9189 Other specified personal risk factors, not elsewhere classified: Secondary | ICD-10-CM

## 2020-05-22 DIAGNOSIS — E7849 Other hyperlipidemia: Secondary | ICD-10-CM | POA: Diagnosis not present

## 2020-05-22 DIAGNOSIS — Z6839 Body mass index (BMI) 39.0-39.9, adult: Secondary | ICD-10-CM

## 2020-05-22 MED ORDER — ATORVASTATIN CALCIUM 10 MG PO TABS: 10.0000 mg | ORAL_TABLET | Freq: Every day | ORAL | 0 refills | Status: DC

## 2020-05-22 MED ORDER — VITAMIN D (ERGOCALCIFEROL) 1.25 MG (50000 UNIT) PO CAPS: 50000.0000 [IU] | ORAL_CAPSULE | ORAL | 0 refills | Status: DC

## 2020-05-26 NOTE — Progress Notes (Signed)
TeleHealth Visit:  Due to the COVID-19 pandemic, this visit was completed with telemedicine (audio/video) technology to reduce patient and provider exposure as well as to preserve personal protective equipment.   Leah Gonzalez has verbally consented to this TeleHealth visit. The patient is located at home, the provider is located at the [facility] office. The participants in this visit include the listed provider and patient. The visit was conducted today via MyChart video.   Chief Complaint: OBESITY Leah Gonzalez is here to discuss her progress with her obesity treatment plan along with follow-up of her obesity related diagnoses. Leah Gonzalez is on the Category 2 Plan or the Pescatarian Plan and states she is following her eating plan approximately 75% of the time. Tal states she is walking for 15 minutes 7 times per week.  Today's visit was #: 10 Starting weight: 227 lbs Starting date: 11/08/2019  Interim History: Leah Gonzalez continues to do well with weight loss. She changed back to her Category 2 plan, and feels she is doing better on this plan. She is working on increasing lean protein and is doing better with meal planning.  Subjective:   1. Other hyperlipidemia Leah Gonzalez is stable on Lipitor and she denies chest pain.  2. Vitamin D deficiency Leah Gonzalez's Vit D level is now at goal on Vit D prescription, but she is at risk of dropping to low again especially in the Winter.  3. Type 2 diabetes mellitus with other specified complication, without long-term current use of insulin (HCC) Leah Gonzalez continue to do very well with weight loss, and adjusting her diet and exercise. Her A1c has improved nicely.   4. At risk for activity intolerance Leah Gonzalez is at risk for exercise intolerance with recent laryngitis and decreased activity.  Assessment/Plan:   1. Other hyperlipidemia Cardiovascular risk and specific lipid/LDL goals reviewed. We discussed several lifestyle modifications today.  Leah Gonzalez will continue to work on diet, exercise and weight loss efforts. We will refill Lipitor, and we will plan to recheck labs in 1 month. Orders and follow up as documented in patient record.   - atorvastatin (LIPITOR) 10 MG tablet; Take 1 tablet (10 mg total) by mouth daily.  Dispense: 30 tablet; Refill: 0  2. Vitamin D deficiency Low Vitamin D level contributes to fatigue and are associated with obesity, breast, and colon cancer. We will refill prescription Vitamin D for 1 month, and we will plan to recheck labs in 1 month. Leah Gonzalez will follow-up for routine testing of Vitamin D, at least 2-3 times per year to avoid over-replacement.  - Vitamin D, Ergocalciferol, (DRISDOL) 1.25 MG (50000 UNIT) CAPS capsule; Take 1 capsule (50,000 Units total) by mouth every 7 (seven) days.  Dispense: 4 capsule; Refill: 0  3. Type 2 diabetes mellitus with other specified complication, without long-term current use of insulin (HCC) Good blood sugar control is important to decrease the likelihood of diabetic complications such as nephropathy, neuropathy, limb loss, blindness, coronary artery disease, and death. Intensive lifestyle modification including diet, exercise and weight loss are the first line of treatment for diabetes. Leah Gonzalez will continue diet and exercise, and we will plan to recheck labs in 1 month.  4. At risk for activity intolerance Leah Gonzalez was given approximately 15 minutes of exercise intolerance counseling today. She is 55 y.o. female and has risk factors exercise intolerance including obesity. We discussed intensive lifestyle modifications today with an emphasis on specific weight loss instructions and strategies. Leah Gonzalez will increase exercise as she feels better.  Repetitive spaced  learning was employed today to elicit superior memory formation and behavioral change.  5. Class 2 severe obesity with serious comorbidity and body mass index (BMI) of 39.0 to 39.9 in adult, unspecified obesity  type (HCC) Leah Gonzalez is currently in the action stage of change. As such, her goal is to continue with weight loss efforts. She has agreed to the Category 2 Plan or the Pescatarian Plan.   Exercise goals: As is.  Behavioral modification strategies: no skipping meals and meal planning and cooking strategies.  Leah Gonzalez has agreed to follow-up with our clinic in 3 weeks. She was informed of the importance of frequent follow-up visits to maximize her success with intensive lifestyle modifications for her multiple health conditions.  Objective:   VITALS: Per patient if applicable, see vitals. GENERAL: Alert and in no acute distress. CARDIOPULMONARY: No increased WOB. Speaking in clear sentences.  PSYCH: Pleasant and cooperative. Speech normal rate and rhythm. Affect is appropriate. Insight and judgement are appropriate. Attention is focused, linear, and appropriate.  NEURO: Oriented as arrived to appointment on time with no prompting.   Lab Results  Component Value Date   CREATININE 0.68 03/17/2020   BUN 11 03/17/2020   NA 138 03/17/2020   K 4.0 03/17/2020   CL 99 03/17/2020   CO2 27 03/17/2020   Lab Results  Component Value Date   ALT 14 03/17/2020   AST 18 03/17/2020   ALKPHOS 83 03/17/2020   BILITOT 0.2 03/17/2020   Lab Results  Component Value Date   HGBA1C 5.7 (H) 03/17/2020   HGBA1C 6.5 (H) 11/08/2019   Lab Results  Component Value Date   INSULIN 7.5 03/17/2020   INSULIN 12.3 11/08/2019   Lab Results  Component Value Date   TSH 1.760 11/08/2019   Lab Results  Component Value Date   CHOL 236 (H) 03/17/2020   HDL 44 03/17/2020   LDLCALC 177 (H) 03/17/2020   TRIG 85 03/17/2020   Lab Results  Component Value Date   WBC 6.8 11/08/2019   HGB 12.2 11/08/2019   HCT 39.0 11/08/2019   MCV 81 11/08/2019   PLT 326 11/08/2019   No results found for: IRON, TIBC, FERRITIN  Attestation Statements:   Reviewed by clinician on day of visit: allergies, medications, problem  list, medical history, surgical history, family history, social history, and previous encounter notes.   I, Burt Knack, am acting as transcriptionist for Quillian Quince, MD.  I have reviewed the above documentation for accuracy and completeness, and I agree with the above. - Quillian Quince, MD

## 2020-06-06 ENCOUNTER — Other Ambulatory Visit (INDEPENDENT_AMBULATORY_CARE_PROVIDER_SITE_OTHER): Payer: Self-pay | Admitting: Family Medicine

## 2020-06-06 DIAGNOSIS — E1169 Type 2 diabetes mellitus with other specified complication: Secondary | ICD-10-CM

## 2020-06-09 ENCOUNTER — Ambulatory Visit (INDEPENDENT_AMBULATORY_CARE_PROVIDER_SITE_OTHER): Payer: BC Managed Care – PPO | Admitting: Adult Health

## 2020-06-09 ENCOUNTER — Other Ambulatory Visit: Payer: Self-pay

## 2020-06-09 ENCOUNTER — Encounter (INDEPENDENT_AMBULATORY_CARE_PROVIDER_SITE_OTHER): Payer: Self-pay | Admitting: Adult Health

## 2020-06-09 VITALS — BP 112/74 | HR 65 | Temp 97.7°F | Ht 60.0 in | Wt 202.0 lb

## 2020-06-09 DIAGNOSIS — E1169 Type 2 diabetes mellitus with other specified complication: Secondary | ICD-10-CM | POA: Diagnosis not present

## 2020-06-09 DIAGNOSIS — Z6839 Body mass index (BMI) 39.0-39.9, adult: Secondary | ICD-10-CM

## 2020-06-09 DIAGNOSIS — E559 Vitamin D deficiency, unspecified: Secondary | ICD-10-CM

## 2020-06-09 DIAGNOSIS — Z9189 Other specified personal risk factors, not elsewhere classified: Secondary | ICD-10-CM | POA: Diagnosis not present

## 2020-06-09 DIAGNOSIS — E785 Hyperlipidemia, unspecified: Secondary | ICD-10-CM | POA: Diagnosis not present

## 2020-06-09 MED ORDER — METFORMIN HCL 500 MG PO TABS
500.0000 mg | ORAL_TABLET | Freq: Every day | ORAL | 0 refills | Status: DC
Start: 2020-06-09 — End: 2020-06-23

## 2020-06-09 MED ORDER — ATORVASTATIN CALCIUM 10 MG PO TABS: 10.0000 mg | ORAL_TABLET | Freq: Every day | ORAL | 0 refills | Status: DC

## 2020-06-10 DIAGNOSIS — Z6841 Body Mass Index (BMI) 40.0 and over, adult: Secondary | ICD-10-CM | POA: Insufficient documentation

## 2020-06-10 DIAGNOSIS — E119 Type 2 diabetes mellitus without complications: Secondary | ICD-10-CM | POA: Insufficient documentation

## 2020-06-10 DIAGNOSIS — E1169 Type 2 diabetes mellitus with other specified complication: Secondary | ICD-10-CM | POA: Insufficient documentation

## 2020-06-10 DIAGNOSIS — E559 Vitamin D deficiency, unspecified: Secondary | ICD-10-CM | POA: Insufficient documentation

## 2020-06-10 DIAGNOSIS — E785 Hyperlipidemia, unspecified: Secondary | ICD-10-CM | POA: Insufficient documentation

## 2020-06-10 DIAGNOSIS — Z6839 Body mass index (BMI) 39.0-39.9, adult: Secondary | ICD-10-CM | POA: Insufficient documentation

## 2020-06-10 NOTE — Progress Notes (Signed)
Chief Complaint:   OBESITY Leah Gonzalez is here to discuss her progress with her obesity treatment plan along with follow-up of her obesity related diagnoses. Leah Gonzalez is on the Category 2 Plan and the Pescatarian Plan and states she is following her eating plan approximately 65% of the time. Leah Gonzalez states she is dancing and steps 15 minutes 7 times per week.  Today's visit was #: 11 Starting weight: 227 lbs Starting date: 11/08/2019 Today's weight: 202 lbs Today's date: 06/09/2020 Total lbs lost to date: 25 lbs Total lbs lost since last in-office visit: 0  Interim History: Leah Gonzalez has been following a hybrid plan of Category 2 meal plan with loosely tracking intake. She will pack her lunch everyday and take it to work. She works in Ec[employer] at [employer]. Interval Goal: Lose down to 200 lbs (currently 202 lbs).  Subjective:   1. Type 2 diabetes mellitus with other specified complication, without long-term current use of insulin (HCC) 03/17/2020 A1c at goal 5.7. CMP resulted GFR 116. Pt is on Metformin 500 mg daily.She denies GI upset.  Lab Results  Component Value Date   HGBA1C 5.7 (H) 03/17/2020   HGBA1C 6.5 (H) 11/08/2019   Lab Results  Component Value Date   LDLCALC 177 (H) 03/17/2020   CREATININE 0.68 03/17/2020   Lab Results  Component Value Date   INSULIN 7.5 03/17/2020   INSULIN 12.3 11/08/2019    2. Hyperlipidemia associated with type 2 diabetes mellitus (HCC) 03/17/2020 Lipid panel total and LDL levels both well above goal. Pt is on atorvastatin 10 mg daily. She began it on 04/07/2020 and has not had labs rechecked since starting statin therapy.  Lab Results  Component Value Date   ALT 14 03/17/2020   AST 18 03/17/2020   ALKPHOS 83 03/17/2020   BILITOT 0.2 03/17/2020   Lab Results  Component Value Date   CHOL 236 (H) 03/17/2020   HDL 44 03/17/2020   LDLCALC 177 (H) 03/17/2020   TRIG 85 03/17/2020    3. Vitamin D deficiency Leah Gonzalez's Vitamin D level  was 51.1 on 03/17/2020. She is currently taking prescription vitamin D 50,000 IU each week. She denies nausea, vomiting or muscle weakness.   Ref. Range 03/17/2020 14:49  Vitamin D, 25-Hydroxy Latest Ref Range: 30.0 - 100.0 ng/mL 51.1   4. At risk for heart disease Leah Gonzalez is at a higher than average risk for cardiovascular disease due to obesity, type 2 diabetes, and hyperlipidemia.  Assessment/Plan:   1. Type 2 diabetes mellitus with other specified complication, without long-term current use of insulin (HCC) Good blood sugar control is important to decrease the likelihood of diabetic complications such as nephropathy, neuropathy, limb loss, blindness, coronary artery disease, and death. Intensive lifestyle modification including diet, exercise and weight loss are the first line of treatment for diabetes. Check labs at next OV.   - metFORMIN (GLUCOPHAGE) 500 MG tablet; Take 1 tablet (500 mg total) by mouth daily with breakfast.  Dispense: 30 tablet; Refill: 0  2. Hyperlipidemia associated with type 2 diabetes mellitus (HCC) Cardiovascular risk and specific lipid/LDL goals reviewed.  We discussed several lifestyle modifications today and Leah Gonzalez will continue to work on diet, exercise and weight loss efforts. Orders and follow up as documented in patient record. Check labs at next OV.  Counseling Intensive lifestyle modifications are the first line treatment for this issue. . Dietary changes: Increase soluble fiber. Decrease simple carbohydrates. . Exercise changes: Moderate to vigorous-intensity aerobic activity 150 minutes per week  if tolerated. . Lipid-lowering medications: see documented in medical record.  - atorvastatin (LIPITOR) 10 MG tablet; Take 1 tablet (10 mg total) by mouth daily.  Dispense: 30 tablet; Refill: 0  3. Vitamin D deficiency Low Vitamin D level contributes to fatigue and are associated with obesity, breast, and colon cancer. She agrees to hold off on prescription  Vit D until labs are completed at next OV.   4. At risk for heart disease Leah Gonzalez was given approximately 15 minutes of coronary artery disease prevention counseling today. She is 55 y.o. female and has risk factors for heart disease including obesity. We discussed intensive lifestyle modifications today with an emphasis on specific weight loss instructions and strategies.   Repetitive spaced learning was employed today to elicit superior memory formation and behavioral change.  5. Class 2 severe obesity with serious comorbidity and body mass index (BMI) of 39.0 to 39.9 in adult, unspecified obesity type (HCC) Leah Gonzalez is currently in the action stage of change. As such, her goal is to continue with weight loss efforts. She has agreed to the Category 2 Plan.   Exercise goals: As is  Behavioral modification strategies: increasing lean protein intake, decreasing simple carbohydrates, increasing water intake, meal planning and cooking strategies and planning for success.  Leah Gonzalez has agreed to follow-up with our clinic in 2 weeks. She was informed of the importance of frequent follow-up visits to maximize her success with intensive lifestyle modifications for her multiple health conditions.   Objective:   Blood pressure 112/74, pulse 65, temperature 97.7 F (36.5 C), height 5' (1.524 m), weight 202 lb (91.6 kg), SpO2 100 %. Body mass index is 39.45 kg/m.  General: Cooperative, alert, well developed, in no acute distress. HEENT: Conjunctivae and lids unremarkable. Cardiovascular: Regular rhythm.  Lungs: Normal work of breathing. Neurologic: No focal deficits.   Lab Results  Component Value Date   CREATININE 0.68 03/17/2020   BUN 11 03/17/2020   NA 138 03/17/2020   K 4.0 03/17/2020   CL 99 03/17/2020   CO2 27 03/17/2020   Lab Results  Component Value Date   ALT 14 03/17/2020   AST 18 03/17/2020   ALKPHOS 83 03/17/2020   BILITOT 0.2 03/17/2020   Lab Results  Component Value  Date   HGBA1C 5.7 (H) 03/17/2020   HGBA1C 6.5 (H) 11/08/2019   Lab Results  Component Value Date   INSULIN 7.5 03/17/2020   INSULIN 12.3 11/08/2019   Lab Results  Component Value Date   TSH 1.760 11/08/2019   Lab Results  Component Value Date   CHOL 236 (H) 03/17/2020   HDL 44 03/17/2020   LDLCALC 177 (H) 03/17/2020   TRIG 85 03/17/2020   Lab Results  Component Value Date   WBC 6.8 11/08/2019   HGB 12.2 11/08/2019   HCT 39.0 11/08/2019   MCV 81 11/08/2019   PLT 326 11/08/2019   No results found for: IRON, TIBC, FERRITIN   Attestation Statements:   Reviewed by clinician on day of visit: allergies, medications, problem list, medical history, surgical history, family history, social history, and previous encounter notes.  Edmund Hilda, am acting as Energy manager for William Hamburger, NP.  I have reviewed the above documentation for accuracy and completeness, and I agree with the above. -  Madylin Fairbank d. Dalanie Kisner, NP-C

## 2020-06-21 ENCOUNTER — Other Ambulatory Visit (INDEPENDENT_AMBULATORY_CARE_PROVIDER_SITE_OTHER): Payer: Self-pay | Admitting: Family Medicine

## 2020-06-21 DIAGNOSIS — E785 Hyperlipidemia, unspecified: Secondary | ICD-10-CM

## 2020-06-21 DIAGNOSIS — E1169 Type 2 diabetes mellitus with other specified complication: Secondary | ICD-10-CM

## 2020-06-23 ENCOUNTER — Encounter (INDEPENDENT_AMBULATORY_CARE_PROVIDER_SITE_OTHER): Payer: Self-pay | Admitting: Adult Health

## 2020-06-23 ENCOUNTER — Ambulatory Visit (INDEPENDENT_AMBULATORY_CARE_PROVIDER_SITE_OTHER): Payer: BC Managed Care – PPO | Admitting: Adult Health

## 2020-06-23 ENCOUNTER — Other Ambulatory Visit: Payer: Self-pay

## 2020-06-23 VITALS — BP 107/70 | HR 74 | Temp 97.9°F | Ht 60.0 in | Wt 204.0 lb

## 2020-06-23 DIAGNOSIS — E559 Vitamin D deficiency, unspecified: Secondary | ICD-10-CM

## 2020-06-23 DIAGNOSIS — E1169 Type 2 diabetes mellitus with other specified complication: Secondary | ICD-10-CM

## 2020-06-23 DIAGNOSIS — Z9189 Other specified personal risk factors, not elsewhere classified: Secondary | ICD-10-CM | POA: Diagnosis not present

## 2020-06-23 DIAGNOSIS — E785 Hyperlipidemia, unspecified: Secondary | ICD-10-CM | POA: Diagnosis not present

## 2020-06-23 DIAGNOSIS — Z6839 Body mass index (BMI) 39.0-39.9, adult: Secondary | ICD-10-CM

## 2020-06-23 MED ORDER — ATORVASTATIN CALCIUM 10 MG PO TABS: 10.0000 mg | ORAL_TABLET | Freq: Every day | ORAL | 0 refills | Status: DC

## 2020-06-23 MED ORDER — METFORMIN HCL 500 MG PO TABS
500.0000 mg | ORAL_TABLET | Freq: Every day | ORAL | 0 refills | Status: DC
Start: 2020-06-23 — End: 2020-07-24

## 2020-06-24 ENCOUNTER — Encounter (INDEPENDENT_AMBULATORY_CARE_PROVIDER_SITE_OTHER): Payer: Self-pay | Admitting: Adult Health

## 2020-06-24 LAB — COMPREHENSIVE METABOLIC PANEL
ALT: 16 IU/L (ref 0–32)
AST: 15 IU/L (ref 0–40)
Albumin/Globulin Ratio: 1.2 (ref 1.2–2.2)
Albumin: 4.2 g/dL (ref 3.8–4.9)
Alkaline Phosphatase: 88 IU/L (ref 44–121)
BUN/Creatinine Ratio: 17 (ref 9–23)
BUN: 12 mg/dL (ref 6–24)
Bilirubin Total: 0.2 mg/dL (ref 0.0–1.2)
CO2: 23 mmol/L (ref 20–29)
Calcium: 9 mg/dL (ref 8.7–10.2)
Chloride: 99 mmol/L (ref 96–106)
Creatinine, Ser: 0.71 mg/dL (ref 0.57–1.00)
GFR calc Af Amer: 113 mL/min/{1.73_m2} (ref >59)
GFR calc non Af Amer: 98 mL/min/{1.73_m2} (ref >59)
Globulin, Total: 3.4 g/dL (ref 1.5–4.5)
Glucose: 79 mg/dL (ref 65–99)
Potassium: 4.1 mmol/L (ref 3.5–5.2)
Sodium: 138 mmol/L (ref 134–144)
Total Protein: 7.6 g/dL (ref 6.0–8.5)

## 2020-06-24 LAB — LIPID PANEL
Chol/HDL Ratio: 3.7 ratio (ref 0.0–4.4)
Cholesterol, Total: 180 mg/dL (ref 100–199)
HDL: 49 mg/dL (ref >39)
LDL Chol Calc (NIH): 120 mg/dL — ABNORMAL HIGH (ref 0–99)
Triglycerides: 58 mg/dL (ref 0–149)
VLDL Cholesterol Cal: 11 mg/dL (ref 5–40)

## 2020-06-24 LAB — HEMOGLOBIN A1C
Est. average glucose Bld gHb Est-mCnc: 123 mg/dL
Hgb A1c MFr Bld: 5.9 % — ABNORMAL HIGH (ref 4.8–5.6)

## 2020-06-24 LAB — INSULIN, RANDOM: INSULIN: 5.2 u[IU]/mL (ref 2.6–24.9)

## 2020-06-24 LAB — VITAMIN D 25 HYDROXY (VIT D DEFICIENCY, FRACTURES): Vit D, 25-Hydroxy: 68.2 ng/mL (ref 30.0–100.0)

## 2020-06-24 NOTE — Progress Notes (Signed)
Chief Complaint:   OBESITY Leah Gonzalez is here to discuss her progress with her obesity treatment plan along with follow-up of her obesity related diagnoses. Leah Gonzalez is on the Category 2 Plan and states she is following her eating plan approximately 85% of the time. Leah Gonzalez states she is walks 15-20 minutes 7 times per week.  Today's visit was #: 12 Starting weight: 227 lbs Starting date: 11/08/2019 Today's weight: 204 Today's date: 06/23/2020 Total lbs lost to date: 23 lbs Total lbs lost since last in-office visit: 0  Interim History: Leah Gonzalez has been snacking more instead of having full meals. She also feels that she has been substituting category 2 meal lunches with "break room food".  Subjective:   1. Type 2 diabetes mellitus with other specified complication, without long-term current use of insulin (HCC) Pt's A1c improved at last check on 03/17/2020 at 5.7. She is on Metformin 500 mg daily at breakfast and tolerating it well.  2. Hyperlipidemia associated with type 2 diabetes mellitus (HCC) Pt's LDL was well above goal at last 2 lipid panels. Atorvastatin 10 mg was started on 04/07/2020.  3. Vitamin D deficiency Leah Gonzalez's Vitamin D level was 51.1 on 03/17/2020. She is currently taking prescription vitamin D 50,000 IU each week. She denies nausea, vomiting or muscle weakness.   Ref. Range 03/17/2020 14:49  Vitamin D, 25-Hydroxy Latest Ref Range: 30.0 - 100.0 ng/mL 51.1   4. At risk for heart disease Leah Gonzalez is at a higher than average risk for cardiovascular disease due to obesity, hyperlipidemia, and type 2 diabetes.  Assessment/Plan:   1. Type 2 diabetes mellitus with other specified complication, without long-term current use of insulin (HCC) Leah Gonzalez will continue to work on weight loss, exercise, and decreasing simple carbohydrates to help decrease the risk of diabetes. Check labs today.   - metFORMIN (GLUCOPHAGE) 500 MG tablet; Take 1 tablet (500 mg total) by mouth  daily with breakfast.  Dispense: 30 tablet; Refill: 0  - Comprehensive metabolic panel - Hemoglobin A1c - Insulin, random  2. Hyperlipidemia associated with type 2 diabetes mellitus (HCC) Cardiovascular risk and specific lipid/LDL goals reviewed.  We discussed several lifestyle modifications today and Leah Gonzalez will continue to work on diet, exercise and weight loss efforts. Orders and follow up as documented in patient record. Check labs today.  Counseling Intensive lifestyle modifications are the first line treatment for this issue. . Dietary changes: Increase soluble fiber. Decrease simple carbohydrates. . Exercise changes: Moderate to vigorous-intensity aerobic activity 150 minutes per week if tolerated. . Lipid-lowering medications: see documented in medical record.  - atorvastatin (LIPITOR) 10 MG tablet; Take 1 tablet (10 mg total) by mouth daily.  Dispense: 30 tablet; Refill: 0 - Lipid panel  3. Vitamin D deficiency Low Vitamin D level contributes to fatigue and are associated with obesity, breast, and colon cancer. She agrees to continue to take prescription Vitamin D @50 ,000 IU every week and will follow-up for routine testing of Vitamin D, at least 2-3 times per year to avoid over-replacement. Check labs today. Will refill Vit D is appropriate, pending lab results.  - VITAMIN D 25 Hydroxy (Vit-D Deficiency, Fractures)  4. At risk for heart disease Leah Gonzalez was given approximately 15 minutes of coronary artery disease prevention counseling today. She is 55 y.o. female and has risk factors for heart disease including obesity. We discussed intensive lifestyle modifications today with an emphasis on specific weight loss instructions and strategies.   Repetitive spaced learning was employed today to elicit  superior memory formation and behavioral change.  5. Class 2 severe obesity with serious comorbidity and body mass index (BMI) of 39.0 to 39.9 in adult, unspecified obesity type  (HCC) Leah Gonzalez is currently in the action stage of change. As such, her goal is to continue with weight loss efforts. She has agreed to the Category 2 Plan.   Exercise goals: As is  Behavioral modification strategies: increasing lean protein intake, decreasing simple carbohydrates, increasing water intake, meal planning and cooking strategies, emotional eating strategies and planning for success.  Leah Gonzalez has agreed to follow-up with our clinic in 2 weeks. She was informed of the importance of frequent follow-up visits to maximize her success with intensive lifestyle modifications for her multiple health conditions.   Leah Gonzalez was informed we would discuss her lab results at her next visit unless there is a critical issue that needs to be addressed sooner. Leah Gonzalez agreed to keep her next visit at the agreed upon time to discuss these results.  Objective:   Blood pressure 107/70, pulse 74, temperature 97.9 F (36.6 C), height 5' (1.524 m), weight 204 lb (92.5 kg), SpO2 100 %. Body mass index is 39.84 kg/m.  General: Cooperative, alert, well developed, in no acute distress. HEENT: Conjunctivae and lids unremarkable. Cardiovascular: Regular rhythm.  Lungs: Normal work of breathing. Neurologic: No focal deficits.   Lab Results  Component Value Date   CREATININE 0.71 06/23/2020   BUN 12 06/23/2020   NA 138 06/23/2020   K 4.1 06/23/2020   CL 99 06/23/2020   CO2 23 06/23/2020   Lab Results  Component Value Date   ALT 16 06/23/2020   AST 15 06/23/2020   ALKPHOS 88 06/23/2020   BILITOT 0.2 06/23/2020   Lab Results  Component Value Date   HGBA1C 5.9 (H) 06/23/2020   HGBA1C 5.7 (H) 03/17/2020   HGBA1C 6.5 (H) 11/08/2019   Lab Results  Component Value Date   INSULIN 5.2 06/23/2020   INSULIN 7.5 03/17/2020   INSULIN 12.3 11/08/2019   Lab Results  Component Value Date   TSH 1.760 11/08/2019   Lab Results  Component Value Date   CHOL 180 06/23/2020   HDL 49 06/23/2020    LDLCALC 120 (H) 06/23/2020   TRIG 58 06/23/2020   CHOLHDL 3.7 06/23/2020   Lab Results  Component Value Date   WBC 6.8 11/08/2019   HGB 12.2 11/08/2019   HCT 39.0 11/08/2019   MCV 81 11/08/2019   PLT 326 11/08/2019   Attestation Statements:   Reviewed by clinician on day of visit: allergies, medications, problem list, medical history, surgical history, family history, social history, and previous encounter notes.  Edmund Hilda, am acting as Energy manager for William Hamburger, NP.  I have reviewed the above documentation for accuracy and completeness, and I agree with the above. -  Trinitie Mcgirr d. Blimi Godby, NP-C

## 2020-07-03 ENCOUNTER — Emergency Department (HOSPITAL_COMMUNITY)
Admission: EM | Admit: 2020-07-03 | Discharge: 2020-07-03 | Disposition: A | Discharge status: Home / Self Care | Payer: BC Managed Care – PPO | Attending: Emergency Medicine | Admitting: Emergency Medicine

## 2020-07-03 ENCOUNTER — Emergency Department (HOSPITAL_COMMUNITY): Payer: BC Managed Care – PPO

## 2020-07-03 ENCOUNTER — Encounter (HOSPITAL_COMMUNITY): Payer: Self-pay | Admitting: Emergency Medicine

## 2020-07-03 DIAGNOSIS — R110 Nausea: Secondary | ICD-10-CM | POA: Diagnosis not present

## 2020-07-03 DIAGNOSIS — E119 Type 2 diabetes mellitus without complications: Secondary | ICD-10-CM | POA: Insufficient documentation

## 2020-07-03 DIAGNOSIS — R55 Syncope and collapse: Secondary | ICD-10-CM | POA: Insufficient documentation

## 2020-07-03 DIAGNOSIS — Z7984 Long term (current) use of oral hypoglycemic drugs: Secondary | ICD-10-CM | POA: Diagnosis not present

## 2020-07-03 LAB — CBC
HCT: 37 % (ref 36.0–46.0)
Hemoglobin: 11.8 g/dL — ABNORMAL LOW (ref 12.0–15.0)
MCH: 26.1 pg (ref 26.0–34.0)
MCHC: 31.9 g/dL (ref 30.0–36.0)
MCV: 81.9 fL (ref 80.0–100.0)
Platelets: 361 10*3/uL (ref 150–400)
RBC: 4.52 MIL/uL (ref 3.87–5.11)
RDW: 15.9 % — ABNORMAL HIGH (ref 11.5–15.5)
WBC: 8.5 10*3/uL (ref 4.0–10.5)
nRBC: 0 % (ref 0.0–0.2)

## 2020-07-03 LAB — BASIC METABOLIC PANEL
Anion gap: 10 (ref 5–15)
BUN: 17 mg/dL (ref 6–20)
CO2: 25 mmol/L (ref 22–32)
Calcium: 9.5 mg/dL (ref 8.9–10.3)
Chloride: 101 mmol/L (ref 98–111)
Creatinine, Ser: 0.78 mg/dL (ref 0.44–1.00)
GFR, Estimated: >60 mL/min (ref >60)
Glucose, Bld: 100 mg/dL — ABNORMAL HIGH (ref 70–99)
Potassium: 4.4 mmol/L (ref 3.5–5.1)
Sodium: 136 mmol/L (ref 135–145)

## 2020-07-03 LAB — TROPONIN I (HIGH SENSITIVITY)
Troponin I (High Sensitivity): 5 ng/L (ref <18)
Troponin I (High Sensitivity): 3 ng/L (ref <18)

## 2020-07-03 MED ORDER — MECLIZINE HCL 25 MG PO TABS: 25.0000 mg | ORAL_TABLET | Freq: Three times a day (TID) | ORAL | 0 refills | Status: DC | PRN

## 2020-07-03 MED ORDER — SODIUM CHLORIDE 0.9 % IV BOLUS
1000.0000 mL | Freq: Once | INTRAVENOUS | Status: AC
  Administered 2020-07-03: 1000 mL via INTRAVENOUS

## 2020-07-03 MED ORDER — MECLIZINE HCL 25 MG PO TABS
25.0000 mg | ORAL_TABLET | Freq: Once | ORAL | Status: AC
  Administered 2020-07-03: 25 mg via ORAL
  Filled 2020-07-03: qty 1

## 2020-07-03 MED ORDER — ACETAMINOPHEN 325 MG PO TABS
650.0000 mg | ORAL_TABLET | Freq: Once | ORAL | Status: AC
  Administered 2020-07-03: 650 mg via ORAL
  Filled 2020-07-03: qty 2

## 2020-07-03 NOTE — Discharge Instructions (Addendum)
It is important you stay hydrated. Follow closely with your primary care provider regarding your visit today. Return for new or worsening symptoms.

## 2020-07-03 NOTE — ED Notes (Signed)
Pt attempted to ambulate to the bathroom as pt stood up, Pt states she feels the room is spinning, Notified MD

## 2020-07-03 NOTE — ED Notes (Signed)
Pt d/c by MD and is provided w/ d/c instructions and follow up care, Pt out of ED in wheel chair with family  

## 2020-07-03 NOTE — ED Provider Notes (Signed)
[facility] EMERGENCY DEPARTMENT Provider Note   CSN: [id number] Arrival date & time: 07/03/20  1324     History No chief complaint on file.   Leah Gonzalez is a 55 y.o. female past medical history of type 2 diabetes, anemia, presenting emergency department for complaint of near syncope.  She states today she had a bowl of cereal for breakfast.  Was at work today and began feeling lightheaded as if she were to pass out.  She states symptoms occur when she is up and moving around with changing of positions.  They are resolved at rest.  She felt a little bit of nausea though no vomiting.  Denies associated chest pain, diaphoresis, vision changes, palpitations.  Currently she just feels groggy with a headache.  Denies recent illness.  No vomiting or diarrhea.  Normal p.o. intake over the last few days.  Is not prescribed any blood pressure medications.  Has had symptoms similar in the past.  Also reports history of anemia.  No cardiac history.  No history of blood clot.  No recent immobilization or trauma.  No history of cancer. No sx of DVT.  The history is provided by the patient.       Past Medical History:  Diagnosis Date  . Back pain   . Dyspnea   . Left hip pain   . Lower extremity edema   . Obesity     Patient Active Problem List   Diagnosis Date Noted  . Diabetes mellitus (HCC) 06/10/2020  . Hyperlipidemia associated with type 2 diabetes mellitus (HCC) 06/10/2020  . Vitamin D deficiency 06/10/2020  . Class 2 severe obesity with serious comorbidity and body mass index (BMI) of 39.0 to 39.9 in adult [facility]) 06/10/2020    Past Surgical History:  Procedure Laterality Date  . ABDOMINAL HYSTERECTOMY    . CESAREAN SECTION    . TUBAL LIGATION       OB History    Gravida  2   Para      Term      Preterm      AB      Living  2     SAB      IAB      Ectopic      Multiple      Live Births              Family History  Problem  Relation Age of Onset  . Asthma Mother   . Hypertension Mother   . Obesity Mother   . Hypertension Father   . Diabetes Father   . Hyperlipidemia Father     Social History   Tobacco Use  . Smoking status: Never Smoker  . Smokeless tobacco: Never Used  Substance Use Topics  . Alcohol use: No  . Drug use: No    Home Medications Prior to Admission medications   Medication Sig Start Date End Date Taking? Authorizing Provider  meclizine (ANTIVERT) 25 MG tablet Take 1 tablet (25 mg total) by mouth 3 (three) times daily as needed for dizziness. 07/03/20  Yes Nikiah Goin, [place] N, PA-C  atorvastatin (LIPITOR) 10 MG tablet Take 1 tablet (10 mg total) by mouth daily. 06/23/20   Danford, Orpha Bur D, NP  celecoxib (CELEBREX) 200 MG capsule Take 200 mg by mouth daily as needed (pain).  10/13/17   [provider]  cyclobenzaprine (FLEXERIL) 10 MG tablet Take 10 mg by mouth daily as needed for muscle spasms.  12/15/17  [provider]  dexlansoprazole (DEXILANT) 60 MG capsule Take 60 mg by mouth daily.    [provider]  melatonin 3 MG TABS tablet Take 3 mg by mouth at bedtime as needed.    [provider]  metFORMIN (GLUCOPHAGE) 500 MG tablet Take 1 tablet (500 mg total) by mouth daily with breakfast. 06/23/20   Danford, Orpha Bur D, NP  Multiple Vitamin (MULTIVITAMIN WITH MINERALS) TABS tablet Take 1 tablet by mouth daily.    [provider]  Probiotic Product (PROBIOTIC PO) Take 1 tablet by mouth daily.    [provider]  triamterene-hydrochlorothiazide (MAXZIDE-25) 37.5-25 MG tablet Take 1 tablet by mouth daily. 12/06/17   [provider]  Vitamin D, Ergocalciferol, (DRISDOL) 1.25 MG (50000 UNIT) CAPS capsule Take 1 capsule (50,000 Units total) by mouth every 7 (seven) days. 05/22/20   Wilder Glade, MD    Allergies    Latex  Review of Systems   Review of Systems  Gastrointestinal: Positive for nausea.  Neurological: Positive for  light-headedness and headaches.  All other systems reviewed and are negative.   Physical Exam Updated Vital Signs BP 118/72   Pulse 75   Temp 98.5 F (36.9 C) (Oral)   Resp 16   SpO2 100%   Physical Exam Vitals and nursing note reviewed.  Constitutional:      Appearance: She is well-developed and well-nourished.  HENT:     Head: Normocephalic and atraumatic.  Eyes:     Extraocular Movements: Extraocular movements intact.     Conjunctiva/sclera: Conjunctivae normal.     Pupils: Pupils are equal, round, and reactive to light.  Cardiovascular:     Rate and Rhythm: Normal rate and regular rhythm.     Pulses: Normal pulses.  Pulmonary:     Effort: Pulmonary effort is normal. No respiratory distress.     Breath sounds: Normal breath sounds.  Abdominal:     General: Bowel sounds are normal.     Palpations: Abdomen is soft.     Tenderness: There is no abdominal tenderness. There is no guarding or rebound.  Musculoskeletal:     Right lower leg: No edema.     Left lower leg: No edema.  Skin:    General: Skin is warm.  Neurological:     Mental Status: She is alert.  Psychiatric:        Mood and Affect: Mood and affect normal.        Behavior: Behavior normal.     ED Results / Procedures / Treatments   Labs (all labs ordered are listed, but only abnormal results are displayed) Labs Reviewed  BASIC METABOLIC PANEL - Abnormal; Notable for the following components:      Result Value   Glucose, Bld 100 (*)    All other components within normal limits  CBC - Abnormal; Notable for the following components:   Hemoglobin 11.8 (*)    RDW 15.9 (*)    All other components within normal limits  TROPONIN I (HIGH SENSITIVITY)  TROPONIN I (HIGH SENSITIVITY)    EKG None  Radiology DG Chest 2 View  Result Date: 07/03/2020 CLINICAL DATA:  Syncope EXAM: CHEST - 2 VIEW COMPARISON:  11/28/2018 FINDINGS: Heart and mediastinal contours are within normal limits. No focal opacities or  effusions. No acute bony abnormality. IMPRESSION: No active cardiopulmonary disease. Electronically Signed   By: Charlett Nose M.D.   On: 07/03/2020 10:27    Procedures Procedures   Medications Ordered in ED  Medications  sodium chloride 0.9 % bolus 1,000 mL (1,000 mLs Intravenous New Bag/Given 07/03/20 1709)  acetaminophen (TYLENOL) tablet 650 mg (650 mg Oral Given 07/03/20 1708)  meclizine (ANTIVERT) tablet 25 mg (25 mg Oral Given 07/03/20 1941)    ED Course  I have reviewed the triage vital signs and the nursing notes.  Pertinent labs & imaging results that were available during my care of the patient were reviewed by me and considered in my medical decision making (see chart for details).  Clinical Course as of 07/03/20 2107  Thu Jul 03, 2020  1910 Patient reports patient went to stand and felt some room spinning dizziness. Only felt this with position change. May be a benign peripheral vertigo. Will dose with meclizine. [JR]    Clinical Course User Index [JR] Larosa Rhines, [place] N, PA-C   MDM Rules/Calculators/A&P                          Patient is presenting for near syncope syncope today.  On examination she feels groggy though no longer lightheaded.  She has no neuro deficits, no cardiac complaints.  Vital signs are stable, no tachycardia or hypotension here.  Her orthostatics are normal.  Her work-up is unremarkable.  Hemoglobin is reassuring at 11.8, centering history of anemia.  Chest x-ray is clear, EKG is nonischemic without arrhythmia.  Metabolic panel is unremarkable.  She is given IV fluids for symptoms with some improvement, however when she stood to ambulate she felt some slight room spinning dizziness.  This is only with position change and resolved with Antivert.  Also consider a benign peripheral vertigo. Meclizine given and symptoms resolved. She is appropriate for discharge at this time. She is instructed to follow with PCP and return if symptoms worsen.  Discussed results,  findings, treatment and follow up. Patient advised of return precautions. Patient verbalized understanding and agreed with plan.  Final Clinical Impression(s) / ED Diagnoses Final diagnoses:  Near syncope    Rx / DC Orders ED Discharge Orders         Ordered    meclizine (ANTIVERT) 25 MG tablet  3 times daily PRN        07/03/20 2106           Shante Maysonet, [place] N, PA-C 07/03/20 2114    Arby Barrette, MD 07/08/20 1731

## 2020-07-03 NOTE — ED Notes (Signed)
While completing orthostatics, pt voice the room felt as if it was spending. No other c/o noted. Nurse notified

## 2020-07-03 NOTE — ED Triage Notes (Signed)
Pt here from work with c/o near syncopal episode , pt states that she is chronically anemic

## 2020-07-07 ENCOUNTER — Encounter (INDEPENDENT_AMBULATORY_CARE_PROVIDER_SITE_OTHER): Payer: Self-pay

## 2020-07-09 ENCOUNTER — Other Ambulatory Visit (INDEPENDENT_AMBULATORY_CARE_PROVIDER_SITE_OTHER): Payer: Self-pay | Admitting: Adult Health

## 2020-07-09 ENCOUNTER — Ambulatory Visit (INDEPENDENT_AMBULATORY_CARE_PROVIDER_SITE_OTHER): Payer: BC Managed Care – PPO | Admitting: Bariatrics

## 2020-07-09 ENCOUNTER — Encounter (INDEPENDENT_AMBULATORY_CARE_PROVIDER_SITE_OTHER): Payer: Self-pay | Admitting: Bariatrics

## 2020-07-09 ENCOUNTER — Other Ambulatory Visit: Payer: Self-pay

## 2020-07-09 ENCOUNTER — Ambulatory Visit (INDEPENDENT_AMBULATORY_CARE_PROVIDER_SITE_OTHER): Payer: BC Managed Care – PPO | Admitting: Adult Health

## 2020-07-09 VITALS — BP 109/77 | HR 83 | Temp 98.6°F | Ht 60.0 in | Wt 206.0 lb

## 2020-07-09 DIAGNOSIS — E1169 Type 2 diabetes mellitus with other specified complication: Secondary | ICD-10-CM

## 2020-07-09 DIAGNOSIS — E785 Hyperlipidemia, unspecified: Secondary | ICD-10-CM | POA: Diagnosis not present

## 2020-07-09 DIAGNOSIS — Z9189 Other specified personal risk factors, not elsewhere classified: Secondary | ICD-10-CM

## 2020-07-09 DIAGNOSIS — Z6841 Body Mass Index (BMI) 40.0 and over, adult: Secondary | ICD-10-CM

## 2020-07-09 DIAGNOSIS — E559 Vitamin D deficiency, unspecified: Secondary | ICD-10-CM

## 2020-07-09 MED ORDER — ATORVASTATIN CALCIUM 10 MG PO TABS: 10.0000 mg | ORAL_TABLET | Freq: Every day | ORAL | 0 refills | Status: DC

## 2020-07-09 MED ORDER — VITAMIN D (ERGOCALCIFEROL) 1.25 MG (50000 UNIT) PO CAPS: 50000.0000 [IU] | ORAL_CAPSULE | ORAL | 0 refills | Status: DC

## 2020-07-10 ENCOUNTER — Encounter (INDEPENDENT_AMBULATORY_CARE_PROVIDER_SITE_OTHER): Payer: Self-pay | Admitting: Bariatrics

## 2020-07-10 NOTE — Progress Notes (Signed)
Chief Complaint:   OBESITY Leah Gonzalez is here to discuss her progress with her obesity treatment plan along with follow-up of her obesity related diagnoses. Leah Gonzalez is on the Category 2 Plan and states she is following her eating plan approximately 85% of the time. Leah Gonzalez states she is doing 0 minutes 0 times per week.  Today's visit was #: 13 Starting weight: 227 lbs Starting date: 11/08/2019 Today's weight: 206 lbs Today's date: 07/09/2020 Total lbs lost to date: 21 lbs Total lbs lost since last in-office visit: 0  Interim History: [name] is up 2 lbs since her last visit. She is doing ok with water and protein. She is up about 2 lbs of water.  Subjective:   1. Hyperlipidemia associated with type 2 diabetes mellitus (HCC) Leah Gonzalez has hyperlipidemia and has been trying to improve her cholesterol levels with intensive lifestyle modification including a low saturated fat diet, exercise and weight loss. She denies any chest pain, claudication or myalgias.  Lab Results  Component Value Date   ALT 16 06/23/2020   AST 15 06/23/2020   ALKPHOS 88 06/23/2020   BILITOT 0.2 06/23/2020   Lab Results  Component Value Date   CHOL 180 06/23/2020   HDL 49 06/23/2020   LDLCALC 120 (H) 06/23/2020   TRIG 58 06/23/2020   CHOLHDL 3.7 06/23/2020    2. Vitamin D deficiency Pt denies nausea, vomiting, and muscle weakness. Pt is on prescription Vit D.   Ref. Range 06/23/2020 15:23  Vitamin D, 25-Hydroxy Latest Ref Range: 30.0 - 100.0 ng/mL 68.2   3. At risk for heart disease Leah Gonzalez is at a higher than average risk for cardiovascular disease due to obesity and hyperlipidemia.  Assessment/Plan:   1. Hyperlipidemia associated with type 2 diabetes mellitus (HCC) Cardiovascular risk and specific lipid/LDL goals reviewed.  We discussed several lifestyle modifications today and Jessika will continue to work on diet, exercise and weight loss efforts. Orders and follow up as documented in patient  record.   Counseling Intensive lifestyle modifications are the first line treatment for this issue. . Dietary changes: Increase soluble fiber. Decrease simple carbohydrates. . Exercise changes: Moderate to vigorous-intensity aerobic activity 150 minutes per week if tolerated. . Lipid-lowering medications: see documented in medical record. - atorvastatin (LIPITOR) 10 MG tablet; Take 1 tablet (10 mg total) by mouth daily.  Dispense: 30 tablet; Refill: 0  2. Vitamin D deficiency Low Vitamin D level contributes to fatigue and are associated with obesity, breast, and colon cancer. She agrees to continue to take prescription Vitamin D @50 ,000 IU every week and will follow-up for routine testing of Vitamin D, at least 2-3 times per year to avoid over-replacement.  - Vitamin D, Ergocalciferol, (DRISDOL) 1.25 MG (50000 UNIT) CAPS capsule; Take 1 capsule (50,000 Units total) by mouth every 7 (seven) days.  Dispense: 4 capsule; Refill: 0  3. At risk for heart disease Leah Gonzalez was given approximately 15 minutes of coronary artery disease prevention counseling today. She is 55 y.o. female and has risk factors for heart disease including obesity. We discussed intensive lifestyle modifications today with an emphasis on specific weight loss instructions and strategies.   Repetitive spaced learning was employed today to elicit superior memory formation and behavioral change.  4. Class 3 severe obesity due to excess calories with serious comorbidity and body mass index (BMI) of 40.0 to 44.9 in adult [facility]) Leah Gonzalez is currently in the action stage of change. As such, her goal is to continue with weight loss  efforts. She has agreed to the Category 2 Plan.   Handout: Recipes II  Exercise goals: Walking and dancing  Behavioral modification strategies: increasing lean protein intake, decreasing simple carbohydrates, increasing vegetables, increasing water intake, decreasing eating out, no skipping meals, meal  planning and cooking strategies, keeping healthy foods in the home and planning for success.  Leah Gonzalez has agreed to follow-up with our clinic in 2-3 weeks with [facility]. She was informed of the importance of frequent follow-up visits to maximize her success with intensive lifestyle modifications for her multiple health conditions.   Objective:   Blood pressure 109/77, pulse 83, temperature 98.6 F (37 C), height 5' (1.524 m), weight 206 lb (93.4 kg), SpO2 100 %. Body mass index is 40.23 kg/m.  General: Cooperative, alert, well developed, in no acute distress. HEENT: Conjunctivae and lids unremarkable. Cardiovascular: Regular rhythm.  Lungs: Normal work of breathing. Neurologic: No focal deficits.   Lab Results  Component Value Date   CREATININE 0.78 07/03/2020   BUN 17 07/03/2020   NA 136 07/03/2020   K 4.4 07/03/2020   CL 101 07/03/2020   CO2 25 07/03/2020   Lab Results  Component Value Date   ALT 16 06/23/2020   AST 15 06/23/2020   ALKPHOS 88 06/23/2020   BILITOT 0.2 06/23/2020   Lab Results  Component Value Date   HGBA1C 5.9 (H) 06/23/2020   HGBA1C 5.7 (H) 03/17/2020   HGBA1C 6.5 (H) 11/08/2019   Lab Results  Component Value Date   INSULIN 5.2 06/23/2020   INSULIN 7.5 03/17/2020   INSULIN 12.3 11/08/2019   Lab Results  Component Value Date   TSH 1.760 11/08/2019   Lab Results  Component Value Date   CHOL 180 06/23/2020   HDL 49 06/23/2020   LDLCALC 120 (H) 06/23/2020   TRIG 58 06/23/2020   CHOLHDL 3.7 06/23/2020   Lab Results  Component Value Date   WBC 8.5 07/03/2020   HGB 11.8 (L) 07/03/2020   HCT 37.0 07/03/2020   MCV 81.9 07/03/2020   PLT 361 07/03/2020    Attestation Statements:   Reviewed by clinician on day of visit: allergies, medications, problem list, medical history, surgical history, family history, social history, and previous encounter notes.  Edmund Hilda, am acting as Energy manager for [employer], DO.  I have reviewed  the above documentation for accuracy and completeness, and I agree with the above. Corinna Capra, DO

## 2020-07-15 ENCOUNTER — Ambulatory Visit: Payer: BC Managed Care – PPO | Attending: Family Medicine

## 2020-07-15 ENCOUNTER — Other Ambulatory Visit: Payer: Self-pay

## 2020-07-15 DIAGNOSIS — H8111 Benign paroxysmal vertigo, right ear: Secondary | ICD-10-CM

## 2020-07-15 DIAGNOSIS — R42 Dizziness and giddiness: Secondary | ICD-10-CM | POA: Diagnosis present

## 2020-07-15 DIAGNOSIS — R2681 Unsteadiness on feet: Secondary | ICD-10-CM | POA: Insufficient documentation

## 2020-07-15 NOTE — Therapy (Signed)
[facility] [address] [place], [place], [address] Phone: [phone]   Fax:  [phone]  Physical Therapy Evaluation  Patient Details  Name: IXEL BOEHNING MRN: [id number] Date of Birth: 04-Jul-1965 Referring Provider (PT): Laurann Montana, MD   Encounter Date: 07/15/2020   PT End of Session - 07/15/20 1444    Visit Number 1    Number of Visits 7    Date for PT Re-Evaluation 09/13/20   POC for 6 weeks, Cert for 60 days   Authorization Type BCBS    PT Start Time 1445    PT Stop Time 1530    PT Time Calculation (min) 45 min    Activity Tolerance Patient tolerated treatment well    Behavior During Therapy [facility] for tasks assessed/performed           Past Medical History:  Diagnosis Date  . Back pain   . Dyspnea   . Left hip pain   . Lower extremity edema   . Obesity     Past Surgical History:  Procedure Laterality Date  . ABDOMINAL HYSTERECTOMY    . CESAREAN SECTION    . TUBAL LIGATION      There were no vitals filed for this visit.    Subjective Assessment - 07/15/20 1448    Subjective Patient presented to ED On 3/3 due to feeling lightheaded along with a headache. Patient reports that she was having a spinning sensation. Patient reports that it comes on suddenly. Patient reports that she was seeing floaters at times, as well as had a sensation of nausea. Patient reports she has been taking it easy and slow movements, staying relaxed to avoid having an episode. Denies changes in vision/hearing. Patient is currently taking meclizine at night. No falls to report.    Pertinent History HLD, DM Type 2, Obesity    Limitations Walking;Standing    Patient Stated Goals Resolve the Dizziness    Currently in Pain? No/denies              [facility] PT Assessment - 07/15/20 0001      Assessment   Medical Diagnosis BPPV/Dizziness    Referring Provider (PT) Laurann Montana, MD    Onset Date/Surgical Date 07/09/20     Prior Therapy None      Precautions   Precautions None      Restrictions   Weight Bearing Restrictions No      Balance Screen   Has the patient fallen in the past 6 months No    Has the patient had a decrease in activity level because of a fear of falling?  No    Is the patient reluctant to leave their home because of a fear of falling?  No      Home Environment   Living Environment Private residence    Living Arrangements Spouse/significant other;Children    Available Help at Discharge Family    Type of Home House    Home Access Stairs to enter    Entrance Stairs-Number of Steps 8    Entrance Stairs-Rails Right;Left    Home Layout One level    Additional Comments being more cautious around the house.      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      Cognition   Overall Cognitive Status Within Functional Limits for tasks assessed  Observation/Other Assessments   Focus on Therapeutic Outcomes (FOTO)  DPS: 53; DFS: 59.2      Sensation   Light Touch Appears Intact      Coordination   Gross Motor Movements are Fluid and Coordinated Yes      Transfers   Transfers Sit to Stand;Stand to Sit    Sit to Stand 7: Independent    Stand to Sit 7: Independent    Comments slow cautious movements      Ambulation/Gait   Ambulation/Gait Yes    Ambulation/Gait Assistance 7: Independent    Assistive device None    Gait Pattern Within Functional Limits    Ambulation Surface Level;Indoor    Gait Comments cautious/slow movements into/out of therapy session              Vestibular Assessment - 07/15/20 0001      Symptom Behavior   Subjective history of current problem See Subjective Info. Patient reports she has had some mild ringing in the ears. Reports spinning sensation. Denies vision changes.    Type of Dizziness  Unsteady with head/body  turns;Lightheadedness;Vertigo;Spinning    Frequency of Dizziness intermittent; everyday    Duration of Dizziness minutes    Symptom Nature Positional;Motion provoked;Intermittent    Aggravating Factors Lying supine;Supine to sit;Sit to stand;Sitting with head tilted back;Looking up to the ceiling    Relieving Factors Slow movements;Rest    Progression of Symptoms No change since onset      Oculomotor Exam   Oculomotor Alignment Normal    Ocular ROM WNL    Spontaneous Absent    Gaze-induced  Absent    Smooth Pursuits Intact    Saccades Undershoots;Comment   reports dizziness with completion   Comment reports eyes strain, frequent blinking and difficult to focus in on the pen      Oculomotor Exam-Fixation Suppressed    Left Head Impulse Normal    Right Head Impulse Normal      Vestibulo-Ocular Reflex   VOR 1 Head Only (x 1 viewing) able to maintain gaze; reports mild dizziness with completion      Auditory   Comments VBI Screen: Negative      Positional Testing   Dix-Hallpike Dix-Hallpike Right;Dix-Hallpike Left    Sidelying Test Sidelying Right;Sidelying Left    Horizontal Canal Testing Horizontal Canal Right      Dix-Hallpike Right   Dix-Hallpike Right Duration no nystagmus noted; reports dizziness/spinning sensation    Dix-Hallpike Right Symptoms No nystagmus      Dix-Hallpike Left   Dix-Hallpike Left Duration 0    Dix-Hallpike Left Symptoms No nystagmus      Sidelying Right   Sidelying Right Duration no nystagmus noted; patient reports extended spinning sensation for approx 60 seconds. also reported increased symptoms coming up from position    Sidelying Right Symptoms No nystagmus      Sidelying Left   Sidelying Left Duration 0    Sidelying Left Symptoms No nystagmus      Horizontal Canal Right   Horizontal Canal Right Duration <3 seconds    Horizontal Canal Right Symptoms Geotrophic   reports mild dizziness, brief geotrophic nystagmus noted     Horizontal Canal  Left   Horizontal Canal Left Duration 0    Horizontal Canal Left Symptoms Normal      Positional Sensitivities   Sit to Supine Lightheadedness    Supine to Sitting Mild dizziness    Right Hallpike Mild dizziness    Up from Right Hallpike Mild  dizziness    Up from Left Hallpike Lightheadedness              Objective measurements completed on examination: See above findings.       PT Education - 07/15/20 1453    Education Details Educated on [employer]OC/Evaluation Findings    Person(s) Educated Patient    Methods Explanation    Comprehension Verbalized understanding            PT Short Term Goals - 07/15/20 1630      PT SHORT TERM GOAL #1   Title Further Vestibular Assesment (DVA/MSQ/Orthostatics) to be assesed and LTG to be set if appropriate    Baseline TBA    Time 3    Period Weeks    Status New    Target Date 08/05/20             PT Long Term Goals - 07/15/20 1630      PT LONG TERM GOAL #1   Title Following treatment for BPPV pt will report 75% reduction in symptoms with bed mobility and supine <> sit (All LTGs Due: 08/26/20)    Baseline currently dizziness with bed mobility and transitional movements    Time 6    Period Weeks    Status New    Target Date 08/26/20      PT LONG TERM GOAL #2   Title Pt will report increase DPS to >/= 58% on FOTO    Baseline DPS: 53%    Time 6    Period Weeks    Status New      PT LONG TERM GOAL #3   Title LTG to be set for Further Vestibular Assesment if applicable (MSQ/DVA/Orthostatics)    Baseline TBA    Time 6    Period Weeks    Status New                  Plan - 07/15/20 1632    Clinical Impression Statement Patient is a 55 y.o. female referred to Neuro OPPT services for BPPV/Vertigo. Patient's PMH is significant for the following: HLD, DM Type 2, Obesity. Upon evaluation patient presents with the following impairments: oculomotor impairments, vertigo with positional changes, as well as R Geotrophic  Nystagmus of Short Duration and Subjective vertigo with R Gilberto Betterix Hallpike which may indicate R Horizontal Canal and R Posterior Canal Canalithiasis. No nystagmus observed with R Gilberto Betterix Hallpike due to possible suppression. May benefit from use of Frenzel Goggles at next session for further assesment.  Patient will benefit from skilled PT services to address impairments stated above and allow for improved tolerance for functional activites and return to PLOF.    Personal Factors and Comorbidities Comorbidity 3+    Comorbidities HLD, DM Type 2, Obesity    Examination-Activity Limitations Bed Mobility;Stand;Reach Overhead    Stability/Clinical Decision Making Stable/Uncomplicated    Clinical Decision Making Low    Rehab Potential Good    PT Frequency 1x / week    PT Duration 6 weeks    PT Treatment/Interventions ADLs/Self Care Home Management;Canalith Repostioning;Cryotherapy;Moist Heat;Gait training;Stair training;Functional mobility training;Therapeutic activities;Therapeutic exercise;Balance training;Neuromuscular re-education;Patient/family education;Manual techniques;Passive range of motion;Vestibular    PT Next Visit Plan Reassess BPPV and treated as indicated; May want to use goggles for assesment as patient may be suppressing. Frequent blinking made it difficult to observe nytsagmus as well. Assess MSQ/Orthostatics/ DVA.    Consulted and Agree with Plan of Care Patient  Patient will benefit from skilled therapeutic intervention in order to improve the following deficits and impairments:  Decreased activity tolerance,Dizziness,Difficulty walking,Decreased balance,Impaired vision/preception  Visit Diagnosis: BPPV (benign paroxysmal positional vertigo), right  Dizziness and giddiness  Unsteadiness on feet     Problem List Patient Active Problem List   Diagnosis Date Noted  . Diabetes mellitus (HCC) 06/10/2020  . Hyperlipidemia associated with type 2 diabetes mellitus (HCC)  06/10/2020  . Vitamin D deficiency 06/10/2020  . Class 2 severe obesity with serious comorbidity and body mass index (BMI) of 39.0 to 39.9 in adult [facility]) 06/10/2020    Tempie Donning, PT, DPT 07/15/2020, 4:44 PM  [facility] [address] [place], [place], [address] Phone: [phone]   Fax:  [phone]  Name: JORITA BOHANON MRN: [id number] Date of Birth: Apr 07, 1966

## 2020-07-18 ENCOUNTER — Other Ambulatory Visit (INDEPENDENT_AMBULATORY_CARE_PROVIDER_SITE_OTHER): Payer: Self-pay | Admitting: Adult Health

## 2020-07-18 DIAGNOSIS — E1169 Type 2 diabetes mellitus with other specified complication: Secondary | ICD-10-CM

## 2020-07-18 DIAGNOSIS — E785 Hyperlipidemia, unspecified: Secondary | ICD-10-CM

## 2020-07-19 ENCOUNTER — Other Ambulatory Visit: Payer: Self-pay | Admitting: Family Medicine

## 2020-07-19 DIAGNOSIS — R42 Dizziness and giddiness: Secondary | ICD-10-CM

## 2020-07-24 ENCOUNTER — Ambulatory Visit (INDEPENDENT_AMBULATORY_CARE_PROVIDER_SITE_OTHER): Payer: BC Managed Care – PPO | Admitting: Adult Health

## 2020-07-24 ENCOUNTER — Encounter (INDEPENDENT_AMBULATORY_CARE_PROVIDER_SITE_OTHER): Payer: Self-pay | Admitting: Adult Health

## 2020-07-24 ENCOUNTER — Other Ambulatory Visit: Payer: Self-pay

## 2020-07-24 VITALS — BP 113/74 | HR 65 | Temp 98.0°F | Ht 60.0 in | Wt 210.0 lb

## 2020-07-24 DIAGNOSIS — Z6841 Body Mass Index (BMI) 40.0 and over, adult: Secondary | ICD-10-CM

## 2020-07-24 DIAGNOSIS — Z9189 Other specified personal risk factors, not elsewhere classified: Secondary | ICD-10-CM | POA: Diagnosis not present

## 2020-07-24 DIAGNOSIS — E1169 Type 2 diabetes mellitus with other specified complication: Secondary | ICD-10-CM | POA: Diagnosis not present

## 2020-07-24 DIAGNOSIS — R600 Localized edema: Secondary | ICD-10-CM | POA: Diagnosis not present

## 2020-07-24 DIAGNOSIS — E559 Vitamin D deficiency, unspecified: Secondary | ICD-10-CM | POA: Diagnosis not present

## 2020-07-24 DIAGNOSIS — E785 Hyperlipidemia, unspecified: Secondary | ICD-10-CM

## 2020-07-24 MED ORDER — METFORMIN HCL 500 MG PO TABS: 500.0000 mg | ORAL_TABLET | Freq: Every day | ORAL | 0 refills | Status: DC

## 2020-07-28 DIAGNOSIS — R600 Localized edema: Secondary | ICD-10-CM | POA: Insufficient documentation

## 2020-07-28 NOTE — Progress Notes (Signed)
Chief Complaint:   OBESITY Leah Gonzalez is here to discuss her progress with her obesity treatment plan along with follow-up of her obesity related diagnoses. Leah Gonzalez is on the Category 2 Plan and states she is following her eating plan approximately 85% of the time. Leah Gonzalez states she is doing 0 minutes 0 times per week.  Today's visit was #: 14 Starting weight: 227 lbs Starting date: 11/08/2019 Today's weight: 210 lbs Today's date: 07/24/2020 Total lbs lost to date: 17 lbs Total lbs lost since last in-office visit: 0  Interim History: Leah Gonzalez is up 4 lbs with 3 lbs of water weight per bioimpedance. She reports increased edema of R knee and bilateral lower extremities since last week.  She is on Maxzide 37.5/25 mg QD, managed by PCP.  She denies chest pain and SOBE.  Subjective:   1. Type 2 diabetes mellitus with other specified complication, without long-term current use of insulin (HCC) Discussed labs with patient today. Leah Gonzalez's 06/23/2020 A1c was 5.9 with normal BG 79 and insulin level 5.2. CMP resulted GFR 113. She is on Metformin 500 mg QD and tolerating it well.  Lab Results  Component Value Date   HGBA1C 5.9 (H) 06/23/2020   HGBA1C 5.7 (H) 03/17/2020   HGBA1C 6.5 (H) 11/08/2019   Lab Results  Component Value Date   LDLCALC 120 (H) 06/23/2020   CREATININE 0.78 07/03/2020   Lab Results  Component Value Date   INSULIN 5.2 06/23/2020   INSULIN 7.5 03/17/2020   INSULIN 12.3 11/08/2019    2. Hyperlipidemia associated with type 2 diabetes mellitus (HCC) Discussed labs with patient today. Leah Gonzalez's 06/23/2020 lipid panel resulted dramatic improvement with atorvastatin 10 mg. All levels at goal with elevated LDL 120. However, LDL was 177 on 03/17/2020 prior to starting statin therapy. Her LFT's within normal limits.  Lab Results  Component Value Date   ALT 16 06/23/2020   AST 15 06/23/2020   ALKPHOS 88 06/23/2020   BILITOT 0.2 06/23/2020   Lab Results  Component  Value Date   CHOL 180 06/23/2020   HDL 49 06/23/2020   LDLCALC 120 (H) 06/23/2020   TRIG 58 06/23/2020   CHOLHDL 3.7 06/23/2020    3. Bilateral lower extremity edema  Discussed labs with patient today. Leah Gonzalez reports increased edema of R knee and bilateral lower extremities since last week.    4. Vitamin D deficiency Discussed labs with patient today. Leah Gonzalez's Vitamin D level was 68.2 on 06/23/2020. She is currently taking prescription vitamin D 50,000 IU each week. She denies nausea, vomiting or muscle weakness.   Ref. Range 06/23/2020 15:23  Vitamin D, 25-Hydroxy Latest Ref Range: 30.0 - 100.0 ng/mL 68.2   5. At risk for heart disease Leah Gonzalez is at a higher than average risk for cardiovascular disease due to obesity, hyperlipidemia, and type 2 diabetes.  Assessment/Plan:   1. Type 2 diabetes mellitus with other specified complication, without long-term current use of insulin (HCC) Good blood sugar control is important to decrease the likelihood of diabetic complications such as nephropathy, neuropathy, limb loss, blindness, coronary artery disease, and death. Intensive lifestyle modification including diet, exercise and weight loss are the first line of treatment for diabetes.   - metFORMIN (GLUCOPHAGE) 500 MG tablet; Take 1 tablet (500 mg total) by mouth daily with breakfast.  Dispense: 30 tablet; Refill: 0  2. Hyperlipidemia associated with type 2 diabetes mellitus (HCC) Cardiovascular risk and specific lipid/LDL goals reviewed.  We discussed several lifestyle modifications today and Leah Gonzalez  will continue to work on diet, exercise and weight loss efforts. Orders and follow up as documented in patient record. If LDL is not at goal at next check, we will increase atorvastatin dosage.  Counseling Intensive lifestyle modifications are the first line treatment for this issue. . Dietary changes: Increase soluble fiber. Decrease simple carbohydrates. . Exercise changes: Moderate to  vigorous-intensity aerobic activity 150 minutes per week if tolerated. . Lipid-lowering medications: see documented in medical record.  3. Bilateral lower extremity edema Follow up with PCP/Dr. Cliffton Asters, about diuretic therapy.  4. Vitamin D deficiency Low Vitamin D level contributes to fatigue and are associated with obesity, breast, and colon cancer. She agrees to convert from prescription Vitamin D to OTC Vit D3 2,000 IU QD and will follow-up for routine testing of Vitamin D, at least 2-3 times per year to avoid over-replacement.  5. At risk for heart disease Leah Gonzalez was given approximately 15 minutes of coronary artery disease prevention counseling today. She is 55 y.o. female and has risk factors for heart disease including obesity. We discussed intensive lifestyle modifications today with an emphasis on specific weight loss instructions and strategies.   Repetitive spaced learning was employed today to elicit superior memory formation and behavioral change.  6. Current BMI 41.2 Leah Gonzalez is currently in the action stage of change. As such, her goal is to continue with weight loss efforts. She has agreed to the Category 2 Plan.   Exercise goals: Dancing  Behavioral modification strategies: increasing lean protein intake, decreasing simple carbohydrates, decreasing sodium intake, meal planning and cooking strategies and planning for success.  Leah Gonzalez has agreed to follow-up with our clinic in 3 weeks. She was informed of the importance of frequent follow-up visits to maximize her success with intensive lifestyle modifications for her multiple health conditions.   Objective:   Blood pressure 113/74, pulse 65, temperature 98 F (36.7 C), height 5' (1.524 m), weight 210 lb (95.3 kg), SpO2 100 %. Body mass index is 41.01 kg/m.  General: Cooperative, alert, well developed, in no acute distress. HEENT: Conjunctivae and lids unremarkable. Cardiovascular: Regular rhythm.  Lungs: Normal work  of breathing. Neurologic: No focal deficits.   Lab Results  Component Value Date   CREATININE 0.78 07/03/2020   BUN 17 07/03/2020   NA 136 07/03/2020   K 4.4 07/03/2020   CL 101 07/03/2020   CO2 25 07/03/2020   Lab Results  Component Value Date   ALT 16 06/23/2020   AST 15 06/23/2020   ALKPHOS 88 06/23/2020   BILITOT 0.2 06/23/2020   Lab Results  Component Value Date   HGBA1C 5.9 (H) 06/23/2020   HGBA1C 5.7 (H) 03/17/2020   HGBA1C 6.5 (H) 11/08/2019   Lab Results  Component Value Date   INSULIN 5.2 06/23/2020   INSULIN 7.5 03/17/2020   INSULIN 12.3 11/08/2019   Lab Results  Component Value Date   TSH 1.760 11/08/2019   Lab Results  Component Value Date   CHOL 180 06/23/2020   HDL 49 06/23/2020   LDLCALC 120 (H) 06/23/2020   TRIG 58 06/23/2020   CHOLHDL 3.7 06/23/2020   Lab Results  Component Value Date   WBC 8.5 07/03/2020   HGB 11.8 (L) 07/03/2020   HCT 37.0 07/03/2020   MCV 81.9 07/03/2020   PLT 361 07/03/2020    Attestation Statements:   Reviewed by clinician on day of visit: allergies, medications, problem list, medical history, surgical history, family history, social history, and previous encounter notes.  Dorene Ar  Bascom Levels, am acting as Energy manager for William Hamburger, NP.  I have reviewed the above documentation for accuracy and completeness, and I agree with the above. -  Alf Doyle d. Tranquilino Fischler, NP-C

## 2020-07-31 ENCOUNTER — Ambulatory Visit: Payer: BC Managed Care – PPO

## 2020-08-01 ENCOUNTER — Ambulatory Visit
Admission: RE | Admit: 2020-08-01 | Discharge: 2020-08-01 | Disposition: A | Discharge status: Home / Self Care | Payer: BC Managed Care – PPO | Source: Ambulatory Visit | Attending: Family Medicine | Admitting: Family Medicine

## 2020-08-01 DIAGNOSIS — R42 Dizziness and giddiness: Secondary | ICD-10-CM

## 2020-08-07 ENCOUNTER — Ambulatory Visit: Payer: BC Managed Care – PPO

## 2020-08-10 ENCOUNTER — Other Ambulatory Visit (INDEPENDENT_AMBULATORY_CARE_PROVIDER_SITE_OTHER): Payer: Self-pay | Admitting: Family Medicine

## 2020-08-10 DIAGNOSIS — E1169 Type 2 diabetes mellitus with other specified complication: Secondary | ICD-10-CM

## 2020-08-11 NOTE — Telephone Encounter (Signed)
Refill request

## 2020-08-11 NOTE — Telephone Encounter (Signed)
Pt last seen by Katy Danford, FNP.  

## 2020-08-14 ENCOUNTER — Ambulatory Visit (INDEPENDENT_AMBULATORY_CARE_PROVIDER_SITE_OTHER): Payer: BC Managed Care – PPO | Admitting: Adult Health

## 2020-08-14 ENCOUNTER — Encounter (INDEPENDENT_AMBULATORY_CARE_PROVIDER_SITE_OTHER): Payer: Self-pay | Admitting: Adult Health

## 2020-08-14 ENCOUNTER — Ambulatory Visit: Payer: BC Managed Care – PPO | Attending: Family Medicine

## 2020-08-14 ENCOUNTER — Other Ambulatory Visit: Payer: Self-pay

## 2020-08-14 VITALS — BP 116/77 | HR 84 | Temp 97.7°F | Ht 60.0 in | Wt 208.0 lb

## 2020-08-14 DIAGNOSIS — E1169 Type 2 diabetes mellitus with other specified complication: Secondary | ICD-10-CM | POA: Diagnosis not present

## 2020-08-14 DIAGNOSIS — Z6841 Body Mass Index (BMI) 40.0 and over, adult: Secondary | ICD-10-CM | POA: Diagnosis not present

## 2020-08-14 DIAGNOSIS — E785 Hyperlipidemia, unspecified: Secondary | ICD-10-CM

## 2020-08-14 DIAGNOSIS — Z9189 Other specified personal risk factors, not elsewhere classified: Secondary | ICD-10-CM | POA: Diagnosis not present

## 2020-08-14 DIAGNOSIS — E66813 Obesity, class 3: Secondary | ICD-10-CM

## 2020-08-14 MED ORDER — ATORVASTATIN CALCIUM 10 MG PO TABS: 10.0000 mg | ORAL_TABLET | Freq: Every day | ORAL | 0 refills | Status: DC

## 2020-08-14 MED ORDER — METFORMIN HCL 500 MG PO TABS: ORAL_TABLET | ORAL | 0 refills | Status: DC

## 2020-08-18 NOTE — Progress Notes (Signed)
Chief Complaint:   OBESITY Leah Gonzalez is here to discuss her progress with her obesity treatment plan along with follow-up of her obesity related diagnoses. Leah Gonzalez is on the Category 2 Plan and states she is following her eating plan approximately 75% of the time. [name] states she is not currently exercising.  Today's visit was #: 15 Starting weight: 227 lbs Starting date: 11/08/2019 Today's weight: 208 lbs Today's date: 08/14/2020 Total lbs lost to date: 19 lbs Total lbs lost since last in-office visit: 2  Interim History: Leah Gonzalez continues to enjoy the structure and foods of category 2. She will often have a tuna, egg sandwich with mustard for lunch. She is officially on Spring Break for a week today! She plans to visit [facility] during the break.  Subjective:   1. Hyperlipidemia associated with type 2 diabetes mellitus (HCC) Leah Gonzalez's lipid panel is much improved at last check. She is on atorvastatin 10 mg QD. She denies myalgias.  Lab Results  Component Value Date   ALT 16 06/23/2020   AST 15 06/23/2020   ALKPHOS 88 06/23/2020   BILITOT 0.2 06/23/2020   Lab Results  Component Value Date   CHOL 180 06/23/2020   HDL 49 06/23/2020   LDLCALC 120 (H) 06/23/2020   TRIG 58 06/23/2020   CHOLHDL 3.7 06/23/2020    2. Type 2 diabetes mellitus with other specified complication, without long-term current use of insulin (HCC) Leah Gonzalez's A1c on 06/23/2020 was 5.9. She is not checking ambulatory BG. She denies symptoms of hypoglycemia. She is on Metformin 500 mg QD and denies GI side effects.  Lab Results  Component Value Date   HGBA1C 5.9 (H) 06/23/2020   HGBA1C 5.7 (H) 03/17/2020   HGBA1C 6.5 (H) 11/08/2019   Lab Results  Component Value Date   LDLCALC 120 (H) 06/23/2020   CREATININE 0.78 07/03/2020   Lab Results  Component Value Date   INSULIN 5.2 06/23/2020   INSULIN 7.5 03/17/2020   INSULIN 12.3 11/08/2019    3. At risk for heart disease Brenna is at a higher  than average risk for cardiovascular disease due to obesity, hyperlipidemia, and type 2 diabetes.  Assessment/Plan:   1. Hyperlipidemia associated with type 2 diabetes mellitus (HCC) Cardiovascular risk and specific lipid/LDL goals reviewed.  We discussed several lifestyle modifications today and Leah Gonzalez will continue to work on diet, exercise and weight loss efforts. Orders and follow up as documented in patient record.   Counseling Intensive lifestyle modifications are the first line treatment for this issue. . Dietary changes: Increase soluble fiber. Decrease simple carbohydrates. . Exercise changes: Moderate to vigorous-intensity aerobic activity 150 minutes per week if tolerated. . Lipid-lowering medications: see documented in medical record.  - atorvastatin (LIPITOR) 10 MG tablet; Take 1 tablet (10 mg total) by mouth daily.  Dispense: 30 tablet; Refill: 0  2. Type 2 diabetes mellitus with other specified complication, without long-term current use of insulin (HCC) Good blood sugar control is important to decrease the likelihood of diabetic complications such as nephropathy, neuropathy, limb loss, blindness, coronary artery disease, and death. Intensive lifestyle modification including diet, exercise and weight loss are the first line of treatment for diabetes.   - metFORMIN (GLUCOPHAGE) 500 MG tablet; TAKE 1 TABLET BY MOUTH EVERY DAY WITH BREAKFAST  Dispense: 30 tablet; Refill: 0  3. At risk for heart disease Leah Gonzalez was given approximately 15 minutes of diabetes education and counseling today. We discussed intensive lifestyle modifications today with an emphasis on weight  loss as well as increasing exercise and decreasing simple carbohydrates in her diet. We also reviewed medication options with an emphasis on risk versus benefit of those discussed.   Repetitive spaced learning was employed today to elicit superior memory formation and behavioral change.  4. Class 3 severe obesity  with serious comorbidity and body mass index (BMI) of 40.0 to 44.9 in adult, unspecified obesity type (HCC) Leah Gonzalez is currently in the action stage of change. As such, her goal is to continue with weight loss efforts. She has agreed to the Category 2 Plan.   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  Leah Gonzalez has agreed to follow-up with our clinic in 3 weeks. She was informed of the importance of frequent follow-up visits to maximize her success with intensive lifestyle modifications for her multiple health conditions.   Objective:   Blood pressure 116/77, pulse 84, temperature 97.7 F (36.5 C), height 5' (1.524 m), weight 208 lb (94.3 kg), SpO2 97 %. Body mass index is 40.62 kg/m.  General: Cooperative, alert, well developed, in no acute distress. HEENT: Conjunctivae and lids unremarkable. Cardiovascular: Regular rhythm.  Lungs: Normal work of breathing. Neurologic: No focal deficits.   Lab Results  Component Value Date   CREATININE 0.78 07/03/2020   BUN 17 07/03/2020   NA 136 07/03/2020   K 4.4 07/03/2020   CL 101 07/03/2020   CO2 25 07/03/2020   Lab Results  Component Value Date   ALT 16 06/23/2020   AST 15 06/23/2020   ALKPHOS 88 06/23/2020   BILITOT 0.2 06/23/2020   Lab Results  Component Value Date   HGBA1C 5.9 (H) 06/23/2020   HGBA1C 5.7 (H) 03/17/2020   HGBA1C 6.5 (H) 11/08/2019   Lab Results  Component Value Date   INSULIN 5.2 06/23/2020   INSULIN 7.5 03/17/2020   INSULIN 12.3 11/08/2019   Lab Results  Component Value Date   TSH 1.760 11/08/2019   Lab Results  Component Value Date   CHOL 180 06/23/2020   HDL 49 06/23/2020   LDLCALC 120 (H) 06/23/2020   TRIG 58 06/23/2020   CHOLHDL 3.7 06/23/2020   Lab Results  Component Value Date   WBC 8.5 07/03/2020   HGB 11.8 (L)  07/03/2020   HCT 37.0 07/03/2020   MCV 81.9 07/03/2020   PLT 361 07/03/2020    Attestation Statements:   Reviewed by clinician on day of visit: allergies, medications, problem list, medical history, surgical history, family history, social history, and previous encounter notes.  Edmund Hilda, am acting as Energy manager for William Hamburger, NP.  I have reviewed the above documentation for accuracy and completeness, and I agree with the above. -  Marise Knapper d. Nakai Pollio, NP-C

## 2020-08-21 ENCOUNTER — Ambulatory Visit: Payer: BC Managed Care – PPO

## 2020-08-23 ENCOUNTER — Ambulatory Visit
Admission: RE | Admit: 2020-08-23 | Discharge: 2020-08-23 | Disposition: A | Discharge status: Home / Self Care | Payer: BC Managed Care – PPO | Source: Ambulatory Visit | Attending: Family Medicine | Admitting: Family Medicine

## 2020-08-25 ENCOUNTER — Other Ambulatory Visit (INDEPENDENT_AMBULATORY_CARE_PROVIDER_SITE_OTHER): Payer: Self-pay | Admitting: Adult Health

## 2020-08-25 DIAGNOSIS — E1169 Type 2 diabetes mellitus with other specified complication: Secondary | ICD-10-CM

## 2020-08-25 NOTE — Telephone Encounter (Signed)
Request for 90 day  supply

## 2020-08-28 ENCOUNTER — Ambulatory Visit: Payer: BC Managed Care – PPO

## 2020-09-02 ENCOUNTER — Encounter (INDEPENDENT_AMBULATORY_CARE_PROVIDER_SITE_OTHER): Payer: Self-pay | Admitting: Family Medicine

## 2020-09-02 ENCOUNTER — Ambulatory Visit (INDEPENDENT_AMBULATORY_CARE_PROVIDER_SITE_OTHER): Payer: BC Managed Care – PPO | Admitting: Family Medicine

## 2020-09-02 ENCOUNTER — Other Ambulatory Visit: Payer: Self-pay

## 2020-09-02 VITALS — BP 107/73 | HR 75 | Temp 97.7°F | Ht 60.0 in | Wt 210.0 lb

## 2020-09-02 DIAGNOSIS — E785 Hyperlipidemia, unspecified: Secondary | ICD-10-CM | POA: Diagnosis not present

## 2020-09-02 DIAGNOSIS — Z6841 Body Mass Index (BMI) 40.0 and over, adult: Secondary | ICD-10-CM | POA: Diagnosis not present

## 2020-09-02 DIAGNOSIS — E1169 Type 2 diabetes mellitus with other specified complication: Secondary | ICD-10-CM | POA: Diagnosis not present

## 2020-09-02 DIAGNOSIS — Z9189 Other specified personal risk factors, not elsewhere classified: Secondary | ICD-10-CM | POA: Diagnosis not present

## 2020-09-02 MED ORDER — ATORVASTATIN CALCIUM 10 MG PO TABS: 10.0000 mg | ORAL_TABLET | Freq: Every day | ORAL | 0 refills | Status: DC

## 2020-09-04 ENCOUNTER — Ambulatory Visit: Payer: BC Managed Care – PPO | Attending: Family Medicine

## 2020-09-10 NOTE — Progress Notes (Signed)
Chief Complaint:   OBESITY Leah Gonzalez is here to discuss her progress with her obesity treatment plan along with follow-up of her obesity related diagnoses.   Today's visit was #: 16 Starting weight: 227 lbs Starting date: 11/08/2019 Today's weight: 210 lbs Today's date: 09/02/2020 Weight change since last visit: +2 lbs Total lbs lost to date: 17 lbs Body mass index is 41.01 kg/m.  Total weight loss percentage to date: -7.49%  Interim History:  Leah Gonzalez's brother is in town visiting, so she has been doing more off plan eating.  She gained 1.4 pounds in water weight since her last visit.  Plan:  Long discussion with her regarding her plan.  Certain substitutions are okay.  Gave her additional handouts.  Current Meal Plan: the Category 2 Plan for 75% of the time.  Current Exercise Plan: Walking at school for 30 minutes 5 times per week.  Assessment/Plan:   No orders of the defined types were placed in this encounter.   Medications Discontinued During This Encounter  Medication Reason  . atorvastatin (LIPITOR) 10 MG tablet Reorder    Meds ordered this encounter  Medications  . atorvastatin (LIPITOR) 10 MG tablet    Sig: Take 1 tablet (10 mg total) by mouth daily.    Dispense:  30 tablet    Refill:  0   1. Hyperlipidemia associated with type 2 diabetes mellitus (HCC) Course: Uncontrolled.  Lipid-lowering medications: Lipitor 10 mg daily.   Plan:  Continue Lipitor.  Dietary changes: Increase soluble fiber, decrease simple carbohydrates, decrease saturated fat. Exercise changes: Moderate to vigorous-intensity aerobic activity 150 minutes per week or as tolerated. We will continue to monitor along with PCP/specialists as it pertains to her weight loss journey.  Lab Results  Component Value Date   CHOL 180 06/23/2020   HDL 49 06/23/2020   LDLCALC 120 (H) 06/23/2020   TRIG 58 06/23/2020   CHOLHDL 3.7 06/23/2020   Lab Results  Component Value Date   ALT 16 06/23/2020    AST 15 06/23/2020   ALKPHOS 88 06/23/2020   BILITOT 0.2 06/23/2020   The 10-year ASCVD risk score Denman George DC Jr., et al., 2013) is: 3.8%   Values used to calculate the score:     Age: 55 years     Sex: Female     Is Non-Hispanic African American: Yes     Diabetic: Yes     Tobacco smoker: No     Systolic Blood Pressure: 114 mmHg     Is BP treated: No     HDL Cholesterol: 49 mg/dL     Total Cholesterol: 180 mg/dL  - Refill atorvastatin (LIPITOR) 10 MG tablet; Take 1 tablet (10 mg total) by mouth daily.  Dispense: 30 tablet; Refill: 0  2. At risk for malnutrition Leah Gonzalez was given extensive malnutrition prevention education and counseling today of more than 8 minutes.  Counseled her that malnutrition refers to inappropriate nutrients or not the right balance of nutrients for optimal health.  Discussed with Leah Gonzalez that it is absolutely possible to be malnourished but yet obese.  Risk factors, including but not limited to, inappropriate dietary choices, difficulty with obtaining food due to physical or financial limitations, and various physical and mental health conditions were reviewed with Leah Gonzalez.   3. Obesity, current BMI 41.2  Course: Leah Gonzalez is currently in the action stage of change. As such, her goal is to continue with weight loss efforts.   Nutrition goals: She has  agreed to the Category 2 Plan.   Exercise goals: For substantial health benefits, adults should do at least 150 minutes (2 hours and 30 minutes) a week of moderate-intensity, or 75 minutes (1 hour and 15 minutes) a week of vigorous-intensity aerobic physical activity, or an equivalent combination of moderate- and vigorous-intensity aerobic activity. Aerobic activity should be performed in episodes of at least 10 minutes, and preferably, it should be spread throughout the week.  Behavioral modification strategies: increasing lean protein intake, decreasing simple carbohydrates, meal planning and cooking  strategies, celebration eating strategies and planning for success.  Leah Gonzalez has agreed to follow-up with our clinic in 2 weeks. She was informed of the importance of frequent follow-up visits to maximize her success with intensive lifestyle modifications for her multiple health conditions.   Objective:   Blood pressure 107/73, pulse 75, temperature 97.7 F (36.5 C), height 5' (1.524 m), weight 210 lb (95.3 kg), SpO2 97 %. Body mass index is 41.01 kg/m.  General: Cooperative, alert, well developed, in no acute distress. HEENT: Conjunctivae and lids unremarkable. Cardiovascular: Regular rhythm.  Lungs: Normal work of breathing. Neurologic: No focal deficits.   Lab Results  Component Value Date   CREATININE 0.78 07/03/2020   BUN 17 07/03/2020   NA 136 07/03/2020   K 4.4 07/03/2020   CL 101 07/03/2020   CO2 25 07/03/2020   Lab Results  Component Value Date   ALT 16 06/23/2020   AST 15 06/23/2020   ALKPHOS 88 06/23/2020   BILITOT 0.2 06/23/2020   Lab Results  Component Value Date   HGBA1C 5.9 (H) 06/23/2020   HGBA1C 5.7 (H) 03/17/2020   HGBA1C 6.5 (H) 11/08/2019   Lab Results  Component Value Date   INSULIN 5.2 06/23/2020   INSULIN 7.5 03/17/2020   INSULIN 12.3 11/08/2019   Lab Results  Component Value Date   TSH 1.760 11/08/2019   Lab Results  Component Value Date   CHOL 180 06/23/2020   HDL 49 06/23/2020   LDLCALC 120 (H) 06/23/2020   TRIG 58 06/23/2020   CHOLHDL 3.7 06/23/2020   Lab Results  Component Value Date   WBC 8.5 07/03/2020   HGB 11.8 (L) 07/03/2020   HCT 37.0 07/03/2020   MCV 81.9 07/03/2020   PLT 361 07/03/2020   Attestation Statements:   Reviewed by clinician on day of visit: allergies, medications, problem list, medical history, surgical history, family history, social history, and previous encounter notes.  I, Insurance claims handler, CMA, am acting as Energy manager for [employer], DO.  I have reviewed the above documentation for  accuracy and completeness, and I agree with the above. Leah Gonzalez, D.O.  The 21st Century Cures Act was signed into law in 2016 which includes the topic of electronic health records.  This provides immediate access to information in MyChart.  This includes consultation notes, operative notes, office notes, lab results and pathology reports.  If you have any questions about what you read please let us know at your next visit so we can discuss your concerns and take corrective action if need be.  We are right here with you.

## 2020-09-16 ENCOUNTER — Ambulatory Visit (INDEPENDENT_AMBULATORY_CARE_PROVIDER_SITE_OTHER): Payer: BC Managed Care – PPO | Admitting: Family Medicine

## 2020-09-16 ENCOUNTER — Other Ambulatory Visit (INDEPENDENT_AMBULATORY_CARE_PROVIDER_SITE_OTHER): Payer: Self-pay | Admitting: Bariatrics

## 2020-09-16 DIAGNOSIS — E785 Hyperlipidemia, unspecified: Secondary | ICD-10-CM

## 2020-09-16 DIAGNOSIS — E1169 Type 2 diabetes mellitus with other specified complication: Secondary | ICD-10-CM

## 2020-09-16 NOTE — Telephone Encounter (Signed)
Dr.Opalski ?

## 2020-09-22 ENCOUNTER — Other Ambulatory Visit: Payer: Self-pay

## 2020-09-22 ENCOUNTER — Encounter (INDEPENDENT_AMBULATORY_CARE_PROVIDER_SITE_OTHER): Payer: Self-pay | Admitting: Family Medicine

## 2020-09-22 ENCOUNTER — Ambulatory Visit (INDEPENDENT_AMBULATORY_CARE_PROVIDER_SITE_OTHER): Payer: BC Managed Care – PPO | Admitting: Family Medicine

## 2020-09-22 VITALS — BP 102/65 | HR 77 | Ht 60.0 in | Wt 210.0 lb

## 2020-09-22 DIAGNOSIS — E7849 Other hyperlipidemia: Secondary | ICD-10-CM | POA: Diagnosis not present

## 2020-09-22 DIAGNOSIS — E66813 Obesity, class 3: Secondary | ICD-10-CM

## 2020-09-22 DIAGNOSIS — Z6841 Body Mass Index (BMI) 40.0 and over, adult: Secondary | ICD-10-CM

## 2020-09-22 DIAGNOSIS — Z9189 Other specified personal risk factors, not elsewhere classified: Secondary | ICD-10-CM

## 2020-09-22 MED ORDER — ATORVASTATIN CALCIUM 10 MG PO TABS: 10.0000 mg | ORAL_TABLET | Freq: Every day | ORAL | 0 refills | Status: DC

## 2020-09-23 NOTE — Progress Notes (Signed)
Chief Complaint:   OBESITY Leah Gonzalez is here to discuss her progress with her obesity treatment plan along with follow-up of her obesity related diagnoses. Leah Gonzalez is on the Category 2 Plan and keeping a food journal and adhering to recommended goals of 1200-1400 calories and 80+ grams of protein daily and states she is following her eating plan approximately 80% of the time. Leah Gonzalez states she is dancing for 15-20 minutes 7 times per week.  Today's visit was #: 17 Starting weight: 227 lbs Starting date: 11/08/2019 Today's weight: 210 lbs Today's date: 09/22/2020 Total lbs lost to date: 17 Total lbs lost since last in-office visit: 0  Interim History: Leah Gonzalez has done well with maintaining her weight. She is making small deviations that are healthy, but may not be meeting all of her protein goals.  Subjective:   1. Other hyperlipidemia Leah Gonzalez is stable on her medications, and she requests a refill today. She denies chest pain or myalgias.  2. At risk for impaired metabolic function Leah Gonzalez is at increased risk for impaired metabolic function if protein decreases.  Assessment/Plan:   1. Other hyperlipidemia Cardiovascular risk and specific lipid/LDL goals reviewed. We discussed several lifestyle modifications today. We will refill Lipitor for 1 month. Leah Gonzalez will continue to work on diet, exercise and weight loss efforts. Orders and follow up as documented in patient record.   - atorvastatin (LIPITOR) 10 MG tablet; Take 1 tablet (10 mg total) by mouth daily.  Dispense: 30 tablet; Refill: 0  2. At risk for impaired metabolic function Leah Gonzalez was given approximately 30 minutes of impaired  metabolic function prevention counseling today. Extensive nutritional education was given today.   Repetitive spaced learning was employed today to elicit superior memory formation and behavioral change.  3. Obesity with current BMI 41.2 Leah Gonzalez is currently in the action stage of change. As  such, her goal is to continue with weight loss efforts. She has agreed to the Category 2 Plan or keeping a food journal and adhering to recommended goals of 1200-1400 calories and 80+ grams of protein daily.   Exercise goals: As is.  Behavioral modification strategies: increasing lean protein intake.  Leah Gonzalez has agreed to follow-up with our clinic in 2 to 3 weeks. She was informed of the importance of frequent follow-up visits to maximize her success with intensive lifestyle modifications for her multiple health conditions.   Objective:   Blood pressure 102/65, pulse 77, height 5' (1.524 m), weight 210 lb (95.3 kg), SpO2 97 %. Body mass index is 41.01 kg/m.  General: Cooperative, alert, well developed, in no acute distress. HEENT: Conjunctivae and lids unremarkable. Cardiovascular: Regular rhythm.  Lungs: Normal work of breathing. Neurologic: No focal deficits.   Lab Results  Component Value Date   CREATININE 0.78 07/03/2020   BUN 17 07/03/2020   NA 136 07/03/2020   K 4.4 07/03/2020   CL 101 07/03/2020   CO2 25 07/03/2020   Lab Results  Component Value Date   ALT 16 06/23/2020   AST 15 06/23/2020   ALKPHOS 88 06/23/2020   BILITOT 0.2 06/23/2020   Lab Results  Component Value Date   HGBA1C 5.9 (H) 06/23/2020   HGBA1C 5.7 (H) 03/17/2020   HGBA1C 6.5 (H) 11/08/2019   Lab Results  Component Value Date   INSULIN 5.2 06/23/2020   INSULIN 7.5 03/17/2020   INSULIN 12.3 11/08/2019   Lab Results  Component Value Date   TSH 1.760 11/08/2019   Lab Results  Component Value Date  CHOL 180 06/23/2020   HDL 49 06/23/2020   LDLCALC 120 (H) 06/23/2020   TRIG 58 06/23/2020   CHOLHDL 3.7 06/23/2020   Lab Results  Component Value Date   WBC 8.5 07/03/2020   HGB 11.8 (L) 07/03/2020   HCT 37.0 07/03/2020   MCV 81.9 07/03/2020   PLT 361 07/03/2020   No results found for: IRON, TIBC, FERRITIN  Attestation Statements:   Reviewed by clinician on day of visit: allergies,  medications, problem list, medical history, surgical history, family history, social history, and previous encounter notes.   I, Burt Knack, am acting as transcriptionist for Quillian Quince, MD.  I have reviewed the above documentation for accuracy and completeness, and I agree with the above. -  Quillian Quince, MD

## 2020-10-01 NOTE — Therapy (Signed)
[facility] [address] [place], [place], [address] Phone: [phone]   Fax:  [phone]  Patient Details  Name: Leah Gonzalez MRN: [id number] Date of Birth: 12-15-1965 Referring Provider:  No ref. provider found  Encounter Date: 10/01/2020 PHYSICAL THERAPY DISCHARGE SUMMARY  Visits from Start of Care: 1  Current functional level related to goals / functional outcomes: Patient is being discharged from PT services due to not returning since initial evaluation.    Remaining deficits: Dizziness   Education / Equipment: None Provided due to not returning for follow up visit  Plan: Patient agrees to discharge.  Patient goals were not met. Patient is being discharged due to not returning since the last visit.  ?????           PT Short Term Goals - 07/15/20 1630      PT SHORT TERM GOAL #1   Title Further Vestibular Assesment (DVA/MSQ/Orthostatics) to be assesed and LTG to be set if appropriate    Baseline TBA    Time 3    Period Weeks    Status New    Target Date 08/05/20           PT Long Term Goals - 07/15/20 1630      PT LONG TERM GOAL #1   Title Following treatment for BPPV pt will report 75% reduction in symptoms with bed mobility and supine <> sit (All LTGs Due: 08/26/20)    Baseline currently dizziness with bed mobility and transitional movements    Time 6    Period Weeks    Status New    Target Date 08/26/20      PT LONG TERM GOAL #2   Title Pt will report increase DPS to >/= 58% on FOTO    Baseline DPS: 53%    Time 6    Period Weeks    Status New      PT LONG TERM GOAL #3   Title LTG to be set for Further Vestibular Assesment if applicable (MSQ/DVA/Orthostatics)    Baseline TBA    Time 6    Period Weeks    Status New            Jones Bales, PT, DPT 10/01/2020, 7:46 AM  [facility] [address] [place],  [place], [address] Phone: [phone]   Fax:  [phone]

## 2020-10-27 ENCOUNTER — Ambulatory Visit (INDEPENDENT_AMBULATORY_CARE_PROVIDER_SITE_OTHER): Payer: BC Managed Care – PPO | Admitting: Family Medicine

## 2020-10-28 ENCOUNTER — Ambulatory Visit (INDEPENDENT_AMBULATORY_CARE_PROVIDER_SITE_OTHER): Payer: BC Managed Care – PPO | Admitting: Family Medicine

## 2020-10-28 ENCOUNTER — Encounter (INDEPENDENT_AMBULATORY_CARE_PROVIDER_SITE_OTHER): Payer: Self-pay | Admitting: Family Medicine

## 2020-10-28 ENCOUNTER — Other Ambulatory Visit: Payer: Self-pay

## 2020-10-28 VITALS — BP 108/67 | HR 75 | Temp 97.9°F | Ht 60.0 in | Wt 210.0 lb

## 2020-10-28 DIAGNOSIS — Z9189 Other specified personal risk factors, not elsewhere classified: Secondary | ICD-10-CM | POA: Diagnosis not present

## 2020-10-28 DIAGNOSIS — E1169 Type 2 diabetes mellitus with other specified complication: Secondary | ICD-10-CM | POA: Diagnosis not present

## 2020-10-28 DIAGNOSIS — E785 Hyperlipidemia, unspecified: Secondary | ICD-10-CM | POA: Diagnosis not present

## 2020-10-28 DIAGNOSIS — Z6841 Body Mass Index (BMI) 40.0 and over, adult: Secondary | ICD-10-CM | POA: Diagnosis not present

## 2020-10-28 MED ORDER — METFORMIN HCL 500 MG PO TABS: ORAL_TABLET | ORAL | 0 refills | Status: DC

## 2020-10-28 MED ORDER — ATORVASTATIN CALCIUM 10 MG PO TABS: 10.0000 mg | ORAL_TABLET | Freq: Every day | ORAL | 0 refills | Status: DC

## 2020-11-04 NOTE — Progress Notes (Signed)
Chief Complaint:   OBESITY Leah Gonzalez is here to discuss her progress with her obesity treatment plan along with follow-up of her obesity related diagnoses. Leah Gonzalez is on the Category 2 Plan or keeping a food journal and adhering to recommended goals of 1200-1400 calories and 80+ grams of protein daily and states she is following her eating plan approximately 75% of the time. Leah Gonzalez states she is walking 3,000 steps daily 7 times per week.  Today's visit was #: 18 Starting weight: 227 lbs Starting date: 11/08/2019 Today's weight: 210 lbs Today's date: 10/28/2020 Total lbs lost to date: 17 Total lbs lost since last in-office visit: 0  Interim History: Leah Gonzalez has done well with maintaining her weight loss. She had a lot of celebrations, but she did well with portion control and smarter choices. She is journaling on and off, and she is ready to get back to structured journaling.  Subjective:   1. Type 2 diabetes mellitus with other specified complication, without long-term current use of insulin (HCC) Leah Gonzalez is doing well with diet and medications. She denies nausea or vomiting.  2. Hyperlipidemia associated with type 2 diabetes mellitus (HCC) Leah Gonzalez is stable on Lipitor, and she denies chest pain or myalgias.  3. At risk for heart disease Leah Gonzalez is at a higher than average risk for cardiovascular disease due to obesity.   Assessment/Plan:   1. Type 2 diabetes mellitus with other specified complication, without long-term current use of insulin (HCC) We will refill metformin for 1 month. Leah Gonzalez will continue with journaling. Good blood sugar control is important to decrease the likelihood of diabetic complications such as nephropathy, neuropathy, limb loss, blindness, coronary artery disease, and death. Intensive lifestyle modification including diet, exercise and weight loss are the first line of treatment for diabetes.   - metFORMIN (GLUCOPHAGE) 500 MG tablet; 1 tab by mouth  daily with breakfast  Dispense: 30 tablet; Refill: 0  2. Hyperlipidemia associated with type 2 diabetes mellitus (HCC) Cardiovascular risk and specific lipid/LDL goals reviewed. We discussed several lifestyle modifications today. Leah Gonzalez will continue to work on diet, exercise and weight loss efforts. We will refill Lipitor for 1 month. Orders and follow up as documented in patient record.   - atorvastatin (LIPITOR) 10 MG tablet; Take 1 tablet (10 mg total) by mouth daily.  Dispense: 30 tablet; Refill: 0  3. At risk for heart disease Leah Gonzalez was given approximately 15 minutes of coronary artery disease prevention counseling today. She is 55 y.o. female and has risk factors for heart disease including obesity. We discussed intensive lifestyle modifications today with an emphasis on specific weight loss instructions and strategies.   Repetitive spaced learning was employed today to elicit superior memory formation and behavioral change.  4. Obesity with current BMI 41.1 Leah Gonzalez is currently in the action stage of change. As such, her goal is to continue with weight loss efforts. She has agreed to keeping a food journal and adhering to recommended goals of 1200-1400 calories and 80+ grams of protein daily.   Exercise goals: As is.  Behavioral modification strategies: increasing lean protein intake and meal planning and cooking strategies.  Leah Gonzalez has agreed to follow-up with our clinic in 2 to 3 weeks. She was informed of the importance of frequent follow-up visits to maximize her success with intensive lifestyle modifications for her multiple health conditions.   Objective:   Blood pressure 108/67, pulse 75, temperature 97.9 F (36.6 C), height 5' (1.524 m), weight 210 lb (95.3 kg),  SpO2 95 %. Body mass index is 41.01 kg/m.  General: Cooperative, alert, well developed, in no acute distress. HEENT: Conjunctivae and lids unremarkable. Cardiovascular: Regular rhythm.  Lungs: Normal work of  breathing. Neurologic: No focal deficits.   Lab Results  Component Value Date   CREATININE 0.78 07/03/2020   BUN 17 07/03/2020   NA 136 07/03/2020   K 4.4 07/03/2020   CL 101 07/03/2020   CO2 25 07/03/2020   Lab Results  Component Value Date   ALT 16 06/23/2020   AST 15 06/23/2020   ALKPHOS 88 06/23/2020   BILITOT 0.2 06/23/2020   Lab Results  Component Value Date   HGBA1C 5.9 (H) 06/23/2020   HGBA1C 5.7 (H) 03/17/2020   HGBA1C 6.5 (H) 11/08/2019   Lab Results  Component Value Date   INSULIN 5.2 06/23/2020   INSULIN 7.5 03/17/2020   INSULIN 12.3 11/08/2019   Lab Results  Component Value Date   TSH 1.760 11/08/2019   Lab Results  Component Value Date   CHOL 180 06/23/2020   HDL 49 06/23/2020   LDLCALC 120 (H) 06/23/2020   TRIG 58 06/23/2020   CHOLHDL 3.7 06/23/2020   Lab Results  Component Value Date   VD25OH 68.2 06/23/2020   VD25OH 51.1 03/17/2020   VD25OH 27.3 (L) 11/08/2019   Lab Results  Component Value Date   WBC 8.5 07/03/2020   HGB 11.8 (L) 07/03/2020   HCT 37.0 07/03/2020   MCV 81.9 07/03/2020   PLT 361 07/03/2020   No results found for: IRON, TIBC, FERRITIN  Attestation Statements:   Reviewed by clinician on day of visit: allergies, medications, problem list, medical history, surgical history, family history, social history, and previous encounter notes.   I, Burt Knack, am acting as transcriptionist for Quillian Quince, MD.  I have reviewed the above documentation for accuracy and completeness, and I agree with the above. -  Quillian Quince, MD

## 2020-11-08 ENCOUNTER — Encounter (HOSPITAL_COMMUNITY): Payer: Self-pay

## 2020-11-08 ENCOUNTER — Ambulatory Visit (HOSPITAL_COMMUNITY)
Admission: EM | Admit: 2020-11-08 | Discharge: 2020-11-08 | Disposition: A | Discharge status: Home / Self Care | Payer: BC Managed Care – PPO | Attending: Emergency Medicine | Admitting: Emergency Medicine

## 2020-11-08 ENCOUNTER — Other Ambulatory Visit: Payer: Self-pay

## 2020-11-08 DIAGNOSIS — Z20822 Contact with and (suspected) exposure to covid-19: Secondary | ICD-10-CM | POA: Diagnosis not present

## 2020-11-08 DIAGNOSIS — J069 Acute upper respiratory infection, unspecified: Secondary | ICD-10-CM | POA: Diagnosis not present

## 2020-11-08 DIAGNOSIS — J029 Acute pharyngitis, unspecified: Secondary | ICD-10-CM | POA: Insufficient documentation

## 2020-11-08 LAB — POCT RAPID STREP A, ED / UC: Streptococcus, Group A Screen (Direct): NEGATIVE

## 2020-11-08 LAB — SARS CORONAVIRUS 2 (TAT 6-24 HRS): SARS Coronavirus 2: NEGATIVE

## 2020-11-08 NOTE — ED Triage Notes (Signed)
Pt presents with sore throat and loss of voice X 3 days.  

## 2020-11-08 NOTE — ED Provider Notes (Signed)
[facility]    CSN: [id number] Arrival date & time: 11/08/20  1003      History   Chief Complaint Chief Complaint  Patient presents with   Sore Throat    HPI Leah Gonzalez is a 55 y.o. female.   Patient here for evaluation of sore throat and loss of voice that has been ongoing for the past several days.  Denies any recent sick contacts.  Denies any fevers but does report having chills intermittently.  Has not taken any OTC medications or treatments.  Denies any trauma, injury, or other precipitating event.  Denies any specific alleviating or aggravating factors.  Denies any fevers, chest pain, shortness of breath, N/V/D, numbness, tingling, weakness, abdominal pain, or headaches.    The history is provided by the patient.  Sore Throat   Past Medical History:  Diagnosis Date   Back pain    Dyspnea    Left hip pain    Lower extremity edema    Obesity     Patient Active Problem List   Diagnosis Date Noted   At risk for hypoglycemia 09/02/2020   Bilateral lower extremity edema 07/28/2020   Diabetes mellitus (HCC) 06/10/2020   Hyperlipidemia associated with type 2 diabetes mellitus (HCC) 06/10/2020   Vitamin D deficiency 06/10/2020   Class 2 severe obesity with serious comorbidity and body mass index (BMI) of 39.0 to 39.9 in adult [facility]) 06/10/2020    Past Surgical History:  Procedure Laterality Date   ABDOMINAL HYSTERECTOMY     CESAREAN SECTION     TUBAL LIGATION      OB History     Gravida  2   Para      Term      Preterm      AB      Living  2      SAB      IAB      Ectopic      Multiple      Live Births               Home Medications    Prior to Admission medications   Medication Sig Start Date End Date Taking? Authorizing Provider  atorvastatin (LIPITOR) 10 MG tablet Take 1 tablet (10 mg total) by mouth daily. 10/28/20   Quillian Quince D, MD  celecoxib (CELEBREX) 200 MG capsule Take 200 mg by mouth daily as needed  (pain).  10/13/17   [provider]  cyclobenzaprine (FLEXERIL) 10 MG tablet Take 10 mg by mouth daily as needed for muscle spasms.  12/15/17   [provider]  dexlansoprazole (DEXILANT) 60 MG capsule Take 60 mg by mouth daily.    [provider]  meclizine (ANTIVERT) 25 MG tablet Take 1 tablet (25 mg total) by mouth 3 (three) times daily as needed for dizziness. 07/03/20   Robinson, [place] N, PA-C  melatonin 3 MG TABS tablet Take 3 mg by mouth at bedtime as needed.    [provider]  metFORMIN (GLUCOPHAGE) 500 MG tablet 1 tab by mouth daily with breakfast 10/28/20   Quillian Quince D, MD  Multiple Vitamin (MULTIVITAMIN WITH MINERALS) TABS tablet Take 1 tablet by mouth daily.    [provider]  Probiotic Product (PROBIOTIC PO) Take 1 tablet by mouth daily.    [provider]  triamterene-hydrochlorothiazide (MAXZIDE-25) 37.5-25 MG tablet Take 1 tablet by mouth daily. 12/06/17   [provider]    Family History Family History  Problem  Relation Age of Onset   Asthma Mother    Hypertension Mother    Obesity Mother    Hypertension Father    Diabetes Father    Hyperlipidemia Father     Social History Social History   Tobacco Use   Smoking status: Never   Smokeless tobacco: Never  Substance Use Topics   Alcohol use: No   Drug use: No     Allergies   Latex   Review of Systems Review of Systems  HENT:  Positive for sore throat, trouble swallowing and voice change.   All other systems reviewed and are negative.   Physical Exam Triage Vital Signs ED Triage Vitals  Enc Vitals Group     BP 11/08/20 1024 129/86     Pulse Rate 11/08/20 1024 75     Resp 11/08/20 1024 17     Temp 11/08/20 1024 98.6 F (37 C)     Temp Source 11/08/20 1024 Oral     SpO2 11/08/20 1024 100 %     Weight --      Height --      Head Circumference --      Peak Flow --      Pain Score 11/08/20 1025 6     Pain Loc --      Pain Edu? --       Excl. in GC? --    No data found.  Updated Vital Signs BP 129/86 (BP Location: Left Arm)   Pulse 75   Temp 98.6 F (37 C) (Oral)   Resp 17   SpO2 100%   Visual Acuity Right Eye Distance:   Left Eye Distance:   Bilateral Distance:    Right Eye Near:   Left Eye Near:    Bilateral Near:     Physical Exam Vitals and nursing note reviewed.  Constitutional:      General: She is not in acute distress.    Appearance: Normal appearance. She is not ill-appearing, toxic-appearing or diaphoretic.  HENT:     Head: Normocephalic and atraumatic.     Right Ear: Tympanic membrane and ear canal normal.     Left Ear: Tympanic membrane and ear canal normal.     Nose: Congestion present.     Mouth/Throat:     Pharynx: Pharyngeal swelling and posterior oropharyngeal erythema present.     Tonsils: No tonsillar exudate or tonsillar abscesses. 1+ on the right. 1+ on the left.  Eyes:     Conjunctiva/sclera: Conjunctivae normal.  Cardiovascular:     Rate and Rhythm: Normal rate and regular rhythm.     Pulses: Normal pulses.     Heart sounds: Normal heart sounds.  Pulmonary:     Effort: Pulmonary effort is normal.     Breath sounds: Normal breath sounds.  Abdominal:     General: Abdomen is flat.  Musculoskeletal:        General: Normal range of motion.     Cervical back: Normal range of motion.  Skin:    General: Skin is warm and dry.  Neurological:     General: No focal deficit present.     Mental Status: She is alert and oriented to person, place, and time.  Psychiatric:        Mood and Affect: Mood normal.     UC Treatments / Results  Labs (all labs ordered are listed, but only abnormal results are displayed) Labs Reviewed  CULTURE, GROUP A STREP [facility])  SARS CORONAVIRUS 2 (TAT  6-24 HRS)  POCT RAPID STREP A, ED / UC    EKG   Radiology No results found.  Procedures Procedures (including critical care time)  Medications Ordered in UC Medications - No data to  display  Initial Impression / Assessment and Plan / UC Course  I have reviewed the triage vital signs and the nursing notes.  Pertinent labs & imaging results that were available during my care of the patient were reviewed by me and considered in my medical decision making (see chart for details).    Assessment negative for red flags or concerns.  Rapid strep negative, throat culture pending.  COVID test pending.  Discussed need to quarantine while waiting on results and then for at least 5 days if the test is positive.  Patient may take Tylenol and/or ibuprofen as needed for pain and fevers.  Discussed conservative symptom management as described in discharge instructions.  Encourage fluids and rest.  Follow-up with primary care as needed. Final Clinical Impressions(s) / UC Diagnoses   Final diagnoses:  Viral URI     Discharge Instructions      We will contact you if your COVID test is positive.  Please quarantine while you wait for the results.  If your test is negative you may resume normal activities.  If your test is positive please continue to quarantine for at least 5 days from your symptom onset or until you are without a fever for at least 24 hours after the medications.  You can take Tylenol and/or Ibuprofen as needed for fever reduction and pain relief.   For cough: honey 1/2 to 1 teaspoon (you can dilute the honey in water or another fluid).  You can also use guaifenesin and dextromethorphan for cough. You can use a humidifier for chest congestion and cough.  If you don't have a humidifier, you can sit in the bathroom with the hot shower running.     For sore throat: try warm salt water gargles, cepacol lozenges, throat spray, warm tea or water with lemon/honey, popsicles or ice, or OTC cold relief medicine for throat discomfort.    For congestion: take a daily anti-histamine like Zyrtec, Claritin, and a oral decongestant, such as pseudoephedrine.  You can also use Flonase 1-2  sprays in each nostril daily.    It is important to stay hydrated: drink plenty of fluids (water, gatorade/powerade/pedialyte, juices, or teas) to keep your throat moisturized and help further relieve irritation/discomfort.   Return or go to the Emergency Department if symptoms worsen or do not improve in the next few days.      ED Prescriptions   None    PDMP not reviewed this encounter.   Ivette Loyal, NP 11/08/20 1054

## 2020-11-08 NOTE — Discharge Instructions (Addendum)

## 2020-11-10 LAB — CULTURE, GROUP A STREP (THRC)

## 2020-11-15 ENCOUNTER — Other Ambulatory Visit (INDEPENDENT_AMBULATORY_CARE_PROVIDER_SITE_OTHER): Payer: Self-pay | Admitting: Family Medicine

## 2020-11-15 DIAGNOSIS — E1169 Type 2 diabetes mellitus with other specified complication: Secondary | ICD-10-CM

## 2020-11-15 DIAGNOSIS — E785 Hyperlipidemia, unspecified: Secondary | ICD-10-CM

## 2020-11-17 NOTE — Telephone Encounter (Signed)
LAST APPOINTMENT DATE: 10/28/2020  NEXT APPOINTMENT DATE: 11/24/2020   [id number] - [place], [place] - [address] AT [facility] [address] [place] [address] Phone: [phone] Fax: [phone]  Patient is requesting a refill of the following medications: Requested Prescriptions   Pending Prescriptions Disp Refills   atorvastatin (LIPITOR) 10 MG tablet [Pharmacy Med Name: ATORVASTATIN 10 MG TABLET] 90 tablet 1    Sig: TAKE 1 TABLET BY MOUTH EVERY DAY    Date last filled: 10/28/20 Previously prescribed by [facility]  Lab Results  Component Value Date   HGBA1C 5.9 (H) 06/23/2020   HGBA1C 5.7 (H) 03/17/2020   HGBA1C 6.5 (H) 11/08/2019   Lab Results  Component Value Date   LDLCALC 120 (H) 06/23/2020   CREATININE 0.78 07/03/2020   Lab Results  Component Value Date   VD25OH 68.2 06/23/2020   VD25OH 51.1 03/17/2020   VD25OH 27.3 (L) 11/08/2019    BP Readings from Last 3 Encounters:  11/08/20 129/86  10/28/20 108/67  09/22/20 102/65

## 2020-11-24 ENCOUNTER — Ambulatory Visit (INDEPENDENT_AMBULATORY_CARE_PROVIDER_SITE_OTHER): Payer: BC Managed Care – PPO | Admitting: Family Medicine

## 2020-11-24 ENCOUNTER — Other Ambulatory Visit: Payer: Self-pay

## 2020-11-24 ENCOUNTER — Encounter (INDEPENDENT_AMBULATORY_CARE_PROVIDER_SITE_OTHER): Payer: Self-pay | Admitting: Family Medicine

## 2020-11-24 VITALS — BP 104/70 | HR 79 | Temp 98.3°F | Ht 60.0 in | Wt 214.0 lb

## 2020-11-24 DIAGNOSIS — Z9189 Other specified personal risk factors, not elsewhere classified: Secondary | ICD-10-CM | POA: Diagnosis not present

## 2020-11-24 DIAGNOSIS — E785 Hyperlipidemia, unspecified: Secondary | ICD-10-CM

## 2020-11-24 DIAGNOSIS — Z6841 Body Mass Index (BMI) 40.0 and over, adult: Secondary | ICD-10-CM

## 2020-11-24 DIAGNOSIS — E1169 Type 2 diabetes mellitus with other specified complication: Secondary | ICD-10-CM | POA: Diagnosis not present

## 2020-11-24 MED ORDER — ATORVASTATIN CALCIUM 10 MG PO TABS: 10.0000 mg | ORAL_TABLET | Freq: Every day | ORAL | 0 refills | Status: DC

## 2020-11-24 MED ORDER — METFORMIN HCL 500 MG PO TABS: ORAL_TABLET | ORAL | 0 refills | Status: DC

## 2020-11-28 NOTE — Progress Notes (Signed)
Chief Complaint:   OBESITY Leah Gonzalez is here to discuss her progress with her obesity treatment plan along with follow-up of her obesity related diagnoses. Leah Gonzalez is on keeping a food journal and adhering to recommended goals of 1200-1400 calories and 80+ grams of protein daily and states she is following her eating plan approximately 75% of the time. Leah Gonzalez states she is doing 0 minutes 0 times per week.  Today's visit was #: 19 Starting weight: 227 lbs Starting date: 11/08/2019 Today's weight: 214 lbs Today's date: 11/24/2020 Total lbs lost to date: 13 Total lbs lost since last in-office visit: 0  Interim History: [name] has had some extra challenges and she is retaining some fluid today. She may not be meeting her protein goals and this could be decreasing her RMR.  Subjective:   1. Type 2 diabetes mellitus with other specified complication, without long-term current use of insulin (HCC) Leah Gonzalez is stable ,on metformin, and she denies nausea, vomiting, or hypoglycemia.  2. Hyperlipidemia, unspecified hyperlipidemia type Leah Gonzalez is stable on Lipitor, and she denies chest pain or myalgias.  3. At risk for dehydration Leah Gonzalez is at risk for dehydration due to inadequate water intake.  Assessment/Plan:   1. Type 2 diabetes mellitus with other specified complication, without long-term current use of insulin (HCC) Leah Gonzalez will continue her medications, and we will refill metformin for 1 month. Good blood sugar control is important to decrease the likelihood of diabetic complications such as nephropathy, neuropathy, limb loss, blindness, coronary artery disease, and death. Intensive lifestyle modification including diet, exercise and weight loss are the first line of treatment for diabetes.   - metFORMIN (GLUCOPHAGE) 500 MG tablet; 1 tab by mouth daily with breakfast  Dispense: 30 tablet; Refill: 0  2. Hyperlipidemia, unspecified hyperlipidemia type Cardiovascular risk and  specific lipid/LDL goals reviewed. We discussed several lifestyle modifications today. We will refill Lipitor for 1 month. Leah Gonzalez will continue to work on diet, exercise and weight loss efforts. Orders and follow up as documented in patient record.   - atorvastatin (LIPITOR) 10 MG tablet; Take 1 tablet (10 mg total) by mouth daily.  Dispense: 30 tablet; Refill: 0  3. At risk for dehydration Leah Gonzalez was given approximately 15 minutes dehydration prevention counseling today. Leah Gonzalez is at risk for dehydration due to weight loss and current medication(s). She was encouraged to hydrate and monitor fluid status to avoid dehydration as well as weight loss plateaus.   4. Obesity with Current BMI 41.9 Leah Gonzalez is currently in the action stage of change. As such, her goal is to continue with weight loss efforts. She has agreed to keeping a food journal and adhering to recommended goals of 1200-1400 calories and 80 grams of protein daily.   Behavioral modification strategies: increasing lean protein intake and increasing water intake.  Leah Gonzalez has agreed to follow-up with our clinic in 3 to 4 weeks. She was informed of the importance of frequent follow-up visits to maximize her success with intensive lifestyle modifications for her multiple health conditions.   Objective:   Blood pressure 104/70, pulse 79, temperature 98.3 F (36.8 C), height 5' (1.524 m), weight 214 lb (97.1 kg), SpO2 97 %. Body mass index is 41.79 kg/m.  General: Cooperative, alert, well developed, in no acute distress. HEENT: Conjunctivae and lids unremarkable. Cardiovascular: Regular rhythm.  Lungs: Normal work of breathing. Neurologic: No focal deficits.   Lab Results  Component Value Date   CREATININE 0.78 07/03/2020   BUN 17 07/03/2020  NA 136 07/03/2020   K 4.4 07/03/2020   CL 101 07/03/2020   CO2 25 07/03/2020   Lab Results  Component Value Date   ALT 16 06/23/2020   AST 15 06/23/2020   ALKPHOS 88 06/23/2020    BILITOT 0.2 06/23/2020   Lab Results  Component Value Date   HGBA1C 5.9 (H) 06/23/2020   HGBA1C 5.7 (H) 03/17/2020   HGBA1C 6.5 (H) 11/08/2019   Lab Results  Component Value Date   INSULIN 5.2 06/23/2020   INSULIN 7.5 03/17/2020   INSULIN 12.3 11/08/2019   Lab Results  Component Value Date   TSH 1.760 11/08/2019   Lab Results  Component Value Date   CHOL 180 06/23/2020   HDL 49 06/23/2020   LDLCALC 120 (H) 06/23/2020   TRIG 58 06/23/2020   CHOLHDL 3.7 06/23/2020   Lab Results  Component Value Date   VD25OH 68.2 06/23/2020   VD25OH 51.1 03/17/2020   VD25OH 27.3 (L) 11/08/2019   Lab Results  Component Value Date   WBC 8.5 07/03/2020   HGB 11.8 (L) 07/03/2020   HCT 37.0 07/03/2020   MCV 81.9 07/03/2020   PLT 361 07/03/2020   No results found for: IRON, TIBC, FERRITIN  Attestation Statements:   Reviewed by clinician on day of visit: allergies, medications, problem list, medical history, surgical history, family history, social history, and previous encounter notes.   I, Burt Knack, am acting as transcriptionist for Quillian Quince, MD.  I have reviewed the above documentation for accuracy and completeness, and I agree with the above. -  Quillian Quince, MD

## 2020-12-25 ENCOUNTER — Encounter (INDEPENDENT_AMBULATORY_CARE_PROVIDER_SITE_OTHER): Payer: Self-pay | Admitting: Family Medicine

## 2020-12-25 ENCOUNTER — Ambulatory Visit (INDEPENDENT_AMBULATORY_CARE_PROVIDER_SITE_OTHER): Payer: BC Managed Care – PPO | Admitting: Family Medicine

## 2020-12-25 ENCOUNTER — Other Ambulatory Visit: Payer: Self-pay

## 2020-12-25 VITALS — BP 100/65 | HR 67 | Temp 97.9°F | Ht 60.0 in | Wt 216.0 lb

## 2020-12-25 DIAGNOSIS — E7849 Other hyperlipidemia: Secondary | ICD-10-CM | POA: Diagnosis not present

## 2020-12-25 DIAGNOSIS — Z9189 Other specified personal risk factors, not elsewhere classified: Secondary | ICD-10-CM

## 2020-12-25 DIAGNOSIS — Z6841 Body Mass Index (BMI) 40.0 and over, adult: Secondary | ICD-10-CM

## 2020-12-25 DIAGNOSIS — E559 Vitamin D deficiency, unspecified: Secondary | ICD-10-CM | POA: Diagnosis not present

## 2020-12-25 DIAGNOSIS — E1169 Type 2 diabetes mellitus with other specified complication: Secondary | ICD-10-CM | POA: Diagnosis not present

## 2020-12-25 MED ORDER — ATORVASTATIN CALCIUM 10 MG PO TABS: 10.0000 mg | ORAL_TABLET | Freq: Every day | ORAL | 0 refills | Status: DC

## 2020-12-25 MED ORDER — OZEMPIC (0.25 OR 0.5 MG/DOSE) 2 MG/1.5ML ~~LOC~~ SOPN: 0.2500 mg | PEN_INJECTOR | SUBCUTANEOUS | 0 refills | Status: DC

## 2020-12-25 MED ORDER — METFORMIN HCL 500 MG PO TABS: ORAL_TABLET | ORAL | 0 refills | Status: DC

## 2020-12-25 NOTE — Progress Notes (Signed)
Chief Complaint:   OBESITY Leah Gonzalez is here to discuss her progress with her obesity treatment plan along with follow-up of her obesity related diagnoses. Leah Gonzalez is on keeping a food journal and adhering to recommended goals of 1200-1400 calories and 80 grams of protein daily and states she is following her eating plan approximately 85% of the time. Leah Gonzalez states she is walking for 40 minutes 1 time per week.  Today's visit was #: 20 Starting weight: 227 lbs Starting date: 11/08/2019 Today's weight: 216 lbs Today's date: 12/25/2020 Total lbs lost to date: 11 Total lbs lost since last in-office visit: 0  Interim History: Leah Gonzalez has been working on diet and exercise, but her weight has slowly been creeping up. She is trying to meet her protein goals.  Subjective:   1. Type 2 diabetes mellitus with other specified complication, without long-term current use of insulin (HCC) Leah Gonzalez is working on diet and exercise, and she is due for labs.  2. Other hyperlipidemia Leah Gonzalez is stable on Lipitor, and she is due for labs.   3. Vitamin D deficiency Leah Gonzalez's last Vit D level is below goal, and she is due for labs.  4. At risk for impaired metabolic function Leah Gonzalez is at increased risk for impaired metabolic function due to current nutrition and muscle mass.  Assessment/Plan:   1. Type 2 diabetes mellitus with other specified complication, without long-term current use of insulin (Leah Gonzalez) Leah Gonzalez agreed to start Ozempic 0.25 mg q weekly with no refills, and we will refill metformin for 1 month. We will check labs today. Good blood sugar control is important to decrease the likelihood of diabetic complications such as nephropathy, neuropathy, limb loss, blindness, coronary artery disease, and death. Intensive lifestyle modification including diet, exercise and weight loss are the first line of treatment for diabetes.   - metFORMIN (GLUCOPHAGE) 500 MG tablet; 1 tab by mouth daily with  breakfast  Dispense: 30 tablet; Refill: 0 - CMP14+EGFR - Semaglutide,0.25 or 0.5MG/DOS, (OZEMPIC, 0.25 OR 0.5 MG/DOSE,) 2 MG/1.5ML SOPN; Inject 0.25 mg into the skin once a week.  Dispense: 1.5 mL; Refill: 0  2. Other hyperlipidemia Cardiovascular risk and specific lipid/LDL goals reviewed. We discussed several lifestyle modifications today. We check labs today, and we will refill Lipitor for 1 month. Leah Gonzalez will continue to work on diet, exercise and weight loss efforts. Orders and follow up as documented in patient record.   - atorvastatin (LIPITOR) 10 MG tablet; Take 1 tablet (10 mg total) by mouth daily.  Dispense: 30 tablet; Refill: 0 - Insulin, random - Hemoglobin A1c - Lipid Panel With LDL/HDL Ratio  3. Vitamin D deficiency Low Vitamin D level contributes to fatigue and are associated with obesity, breast, and colon cancer. We will check labs today, and Leah Gonzalez will follow-up for routine testing of Vitamin D, at least 2-3 times per year to avoid over-replacement.  - VITAMIN D 25 Hydroxy (Vit-D Deficiency, Fractures)  4. At risk for impaired metabolic function Leah Gonzalez was given approximately 30 minutes of impaired  metabolic function prevention counseling today. We discussed intensive lifestyle modifications today with an emphasis on specific nutrition and exercise instructions and strategies.   Repetitive spaced learning was employed today to elicit superior memory formation and behavioral change.  5. Obesity with current BMI 42.3 Leah Gonzalez is currently in the action stage of change. As such, her goal is to continue with weight loss efforts. She has agreed to the Category 2 Plan or keeping a food journal and  adhering to recommended goals of 1200-1400 calories and 80+ grams of protein daily.   Exercise goals: As is.  Behavioral modification strategies: increasing lean protein intake and meal planning and cooking strategies.  Leah Gonzalez has agreed to follow-up with our clinic in 2 to 3  weeks. She was informed of the importance of frequent follow-up visits to maximize her success with intensive lifestyle modifications for her multiple health conditions.   Leah Gonzalez was informed we would discuss her lab results at her next visit unless there is a critical issue that needs to be addressed sooner. Leah Gonzalez agreed to keep her next visit at the agreed upon time to discuss these results.  Objective:   Blood pressure 100/65, pulse 67, temperature 97.9 F (36.6 C), height 5' (1.524 m), weight 216 lb (98 kg), SpO2 98 %. Body mass index is 42.18 kg/m.  General: Cooperative, alert, well developed, in no acute distress. HEENT: Conjunctivae and lids unremarkable. Cardiovascular: Regular rhythm.  Lungs: Normal work of breathing. Neurologic: No focal deficits.   Lab Results  Component Value Date   CREATININE 0.78 07/03/2020   BUN 17 07/03/2020   NA 136 07/03/2020   K 4.4 07/03/2020   CL 101 07/03/2020   CO2 25 07/03/2020   Lab Results  Component Value Date   ALT 16 06/23/2020   AST 15 06/23/2020   ALKPHOS 88 06/23/2020   BILITOT 0.2 06/23/2020   Lab Results  Component Value Date   HGBA1C 5.9 (H) 06/23/2020   HGBA1C 5.7 (H) 03/17/2020   HGBA1C 6.5 (H) 11/08/2019   Lab Results  Component Value Date   INSULIN 5.2 06/23/2020   INSULIN 7.5 03/17/2020   INSULIN 12.3 11/08/2019   Lab Results  Component Value Date   TSH 1.760 11/08/2019   Lab Results  Component Value Date   CHOL 180 06/23/2020   HDL 49 06/23/2020   LDLCALC 120 (H) 06/23/2020   TRIG 58 06/23/2020   CHOLHDL 3.7 06/23/2020   Lab Results  Component Value Date   VD25OH 68.2 06/23/2020   VD25OH 51.1 03/17/2020   VD25OH 27.3 (L) 11/08/2019   Lab Results  Component Value Date   WBC 8.5 07/03/2020   HGB 11.8 (L) 07/03/2020   HCT 37.0 07/03/2020   MCV 81.9 07/03/2020   PLT 361 07/03/2020   No results found for: IRON, TIBC, FERRITIN  Attestation Statements:   Reviewed by clinician on day of  visit: allergies, medications, problem list, medical history, surgical history, family history, social history, and previous encounter notes.   I, Trixie Dredge, am acting as transcriptionist for Dennard Nip, MD.  I have reviewed the above documentation for accuracy and completeness, and I agree with the above. -  Dennard Nip, MD

## 2020-12-26 LAB — CMP14+EGFR
ALT: 18 IU/L (ref 0–32)
AST: 14 IU/L (ref 0–40)
Albumin/Globulin Ratio: 1.3 (ref 1.2–2.2)
Albumin: 4.4 g/dL (ref 3.8–4.9)
Alkaline Phosphatase: 78 IU/L (ref 44–121)
BUN/Creatinine Ratio: 22 (ref 9–23)
BUN: 15 mg/dL (ref 6–24)
Bilirubin Total: 0.3 mg/dL (ref 0.0–1.2)
CO2: 28 mmol/L (ref 20–29)
Calcium: 9.3 mg/dL (ref 8.7–10.2)
Chloride: 103 mmol/L (ref 96–106)
Creatinine, Ser: 0.67 mg/dL (ref 0.57–1.00)
Globulin, Total: 3.4 g/dL (ref 1.5–4.5)
Glucose: 96 mg/dL (ref 65–99)
Potassium: 4.3 mmol/L (ref 3.5–5.2)
Sodium: 143 mmol/L (ref 134–144)
Total Protein: 7.8 g/dL (ref 6.0–8.5)
eGFR: 104 mL/min/{1.73_m2} (ref >59)

## 2020-12-26 LAB — LIPID PANEL WITH LDL/HDL RATIO
Cholesterol, Total: 212 mg/dL — ABNORMAL HIGH (ref 100–199)
HDL: 42 mg/dL (ref >39)
LDL Chol Calc (NIH): 152 mg/dL — ABNORMAL HIGH (ref 0–99)
LDL/HDL Ratio: 3.6 ratio — ABNORMAL HIGH (ref 0.0–3.2)
Triglycerides: 98 mg/dL (ref 0–149)
VLDL Cholesterol Cal: 18 mg/dL (ref 5–40)

## 2020-12-26 LAB — HEMOGLOBIN A1C
Est. average glucose Bld gHb Est-mCnc: 134 mg/dL
Hgb A1c MFr Bld: 6.3 % — ABNORMAL HIGH (ref 4.8–5.6)

## 2020-12-26 LAB — INSULIN, RANDOM: INSULIN: 18.4 u[IU]/mL (ref 2.6–24.9)

## 2020-12-26 LAB — VITAMIN D 25 HYDROXY (VIT D DEFICIENCY, FRACTURES): Vit D, 25-Hydroxy: 49.1 ng/mL (ref 30.0–100.0)

## 2021-01-06 ENCOUNTER — Other Ambulatory Visit (INDEPENDENT_AMBULATORY_CARE_PROVIDER_SITE_OTHER): Payer: Self-pay | Admitting: Family Medicine

## 2021-01-06 DIAGNOSIS — E7849 Other hyperlipidemia: Secondary | ICD-10-CM

## 2021-01-14 ENCOUNTER — Ambulatory Visit (INDEPENDENT_AMBULATORY_CARE_PROVIDER_SITE_OTHER): Payer: BC Managed Care – PPO | Admitting: Family Medicine

## 2021-01-29 ENCOUNTER — Other Ambulatory Visit: Payer: Self-pay

## 2021-01-29 ENCOUNTER — Ambulatory Visit (INDEPENDENT_AMBULATORY_CARE_PROVIDER_SITE_OTHER): Payer: BC Managed Care – PPO | Admitting: Family Medicine

## 2021-01-29 ENCOUNTER — Encounter (INDEPENDENT_AMBULATORY_CARE_PROVIDER_SITE_OTHER): Payer: Self-pay | Admitting: Family Medicine

## 2021-01-29 VITALS — BP 105/66 | HR 66 | Temp 97.8°F | Ht 60.0 in | Wt 209.0 lb

## 2021-01-29 DIAGNOSIS — E1169 Type 2 diabetes mellitus with other specified complication: Secondary | ICD-10-CM | POA: Diagnosis not present

## 2021-01-29 DIAGNOSIS — Z9189 Other specified personal risk factors, not elsewhere classified: Secondary | ICD-10-CM

## 2021-01-29 DIAGNOSIS — E7849 Other hyperlipidemia: Secondary | ICD-10-CM

## 2021-01-29 DIAGNOSIS — Z6841 Body Mass Index (BMI) 40.0 and over, adult: Secondary | ICD-10-CM

## 2021-01-29 MED ORDER — OZEMPIC (0.25 OR 0.5 MG/DOSE) 2 MG/1.5ML ~~LOC~~ SOPN: 0.2500 mg | PEN_INJECTOR | SUBCUTANEOUS | 0 refills | Status: DC

## 2021-01-29 MED ORDER — METFORMIN HCL 500 MG PO TABS: ORAL_TABLET | ORAL | 0 refills | Status: DC

## 2021-01-29 MED ORDER — ATORVASTATIN CALCIUM 10 MG PO TABS: 10.0000 mg | ORAL_TABLET | Freq: Every day | ORAL | 0 refills | Status: DC

## 2021-01-29 NOTE — Progress Notes (Signed)
Chief Complaint:   OBESITY Leah Gonzalez is here to discuss her progress with her obesity treatment plan along with follow-up of her obesity related diagnoses. Leah Gonzalez is on the Category 2 Plan or keeping a food journal and adhering to recommended goals of 1200-1400 calories and 80+ grams of protein daily and states she is following her eating plan approximately 75% of the time. Leah Gonzalez states she is doing cardio and walking for 45-60 minutes 3 times per week.  Today's visit was #: 21 Starting weight: 227 lbs Starting date: 11/08/2019 Today's weight: 209 lbs Today's date: 01/29/2021 Total lbs lost to date: 18 Total lbs lost since last in-office visit: 7  Interim History: Leah Gonzalez continues to do well with weight loss. She is working on increasing protein, but she may not be meeting her goals all the way.  Subjective:   1. Type 2 diabetes mellitus with other specified complication, without long-term current use of insulin (HCC) Leah Gonzalez started Ozempic and she notes decreased polyphagia. She is making better food choices.   2. Other hyperlipidemia Leah Gonzalez is on Lipitor now. She is working on diet and exercise. Her last labs had worsened.   3. At risk for heart disease Leah Gonzalez is at a higher than average risk for cardiovascular disease due to obesity.   Assessment/Plan:   1. Type 2 diabetes mellitus with other specified complication, without long-term current use of insulin (HCC) Leah Gonzalez will continue her medications and we will refill Ozempic 0.25 mg q weekly for 1 month, and refill metformin 500 mg q AM #30 for 1 month. We will recheck labs in 2 months. Good blood sugar control is important to decrease the likelihood of diabetic complications such as nephropathy, neuropathy, limb loss, blindness, coronary artery disease, and death. Intensive lifestyle modification including diet, exercise and weight loss are the first line of treatment for diabetes.   2. Other  hyperlipidemia Cardiovascular risk and specific lipid/LDL goals reviewed. We discussed several lifestyle modifications today. We will refill Lipitor 10 mg q daily #30 for 1 month, and we will recheck labs in 2 months. Leah Gonzalez will continue to work on diet, exercise and weight loss efforts. Orders and follow up as documented in patient record.   3. At risk for heart disease Leah Gonzalez was given approximately 15 minutes of coronary artery disease prevention counseling today. She is 55 y.o. female and has risk factors for heart disease including obesity. We discussed intensive lifestyle modifications today with an emphasis on specific weight loss instructions and strategies.   Repetitive spaced learning was employed today to elicit superior memory formation and behavioral change.  4. Obesity with current BMI of 40.9 Leah Gonzalez is currently in the action stage of change. As such, her goal is to continue with weight loss efforts. She has agreed to keeping a food journal and adhering to recommended goals of 1200-1400 calories and 80+ grams of protein daily.   We discussed food options to increase protein and recipes were given as well.  Exercise goals: As is.  Behavioral modification strategies: increasing lean protein intake and no skipping meals.  Leah Gonzalez has agreed to follow-up with our clinic in 4 weeks. She was informed of the importance of frequent follow-up visits to maximize her success with intensive lifestyle modifications for her multiple health conditions.   Objective:   Blood pressure 105/66, pulse 66, temperature 97.8 F (36.6 C), height 5' (1.524 m), weight 209 lb (94.8 kg), SpO2 99 %. Body mass index is 40.82 kg/m.  General: Cooperative,  alert, well developed, in no acute distress. HEENT: Conjunctivae and lids unremarkable. Cardiovascular: Regular rhythm.  Lungs: Normal work of breathing. Neurologic: No focal deficits.   Lab Results  Component Value Date   CREATININE 0.67  12/25/2020   BUN 15 12/25/2020   NA 143 12/25/2020   K 4.3 12/25/2020   CL 103 12/25/2020   CO2 28 12/25/2020   Lab Results  Component Value Date   ALT 18 12/25/2020   AST 14 12/25/2020   ALKPHOS 78 12/25/2020   BILITOT 0.3 12/25/2020   Lab Results  Component Value Date   HGBA1C 6.3 (H) 12/25/2020   HGBA1C 5.9 (H) 06/23/2020   HGBA1C 5.7 (H) 03/17/2020   HGBA1C 6.5 (H) 11/08/2019   Lab Results  Component Value Date   INSULIN 18.4 12/25/2020   INSULIN 5.2 06/23/2020   INSULIN 7.5 03/17/2020   INSULIN 12.3 11/08/2019   Lab Results  Component Value Date   TSH 1.760 11/08/2019   Lab Results  Component Value Date   CHOL 212 (H) 12/25/2020   HDL 42 12/25/2020   LDLCALC 152 (H) 12/25/2020   TRIG 98 12/25/2020   CHOLHDL 3.7 06/23/2020   Lab Results  Component Value Date   VD25OH 49.1 12/25/2020   VD25OH 68.2 06/23/2020   VD25OH 51.1 03/17/2020   Lab Results  Component Value Date   WBC 8.5 07/03/2020   HGB 11.8 (L) 07/03/2020   HCT 37.0 07/03/2020   MCV 81.9 07/03/2020   PLT 361 07/03/2020   No results found for: IRON, TIBC, FERRITIN  Attestation Statements:   Reviewed by clinician on day of visit: allergies, medications, problem list, medical history, surgical history, family history, social history, and previous encounter notes.   I, Burt Knack, am acting as transcriptionist for Quillian Quince, MD.  I have reviewed the above documentation for accuracy and completeness, and I agree with the above. -  Quillian Quince, MD

## 2021-02-05 ENCOUNTER — Encounter: Payer: Self-pay | Admitting: *Deleted

## 2021-02-05 ENCOUNTER — Other Ambulatory Visit: Payer: Self-pay | Admitting: *Deleted

## 2021-02-09 ENCOUNTER — Encounter: Payer: Self-pay | Admitting: Psychiatry

## 2021-02-09 ENCOUNTER — Ambulatory Visit: Payer: BC Managed Care – PPO | Admitting: Psychiatry

## 2021-02-09 ENCOUNTER — Other Ambulatory Visit: Payer: Self-pay

## 2021-02-09 VITALS — BP 117/81 | HR 76 | Ht 59.0 in | Wt 212.0 lb

## 2021-02-09 DIAGNOSIS — G43709 Chronic migraine without aura, not intractable, without status migrainosus: Secondary | ICD-10-CM | POA: Diagnosis not present

## 2021-02-09 MED ORDER — ELETRIPTAN HYDROBROMIDE 40 MG PO TABS: 40.0000 mg | ORAL_TABLET | ORAL | 0 refills | Status: DC | PRN

## 2021-02-09 MED ORDER — TOPIRAMATE 25 MG PO TABS: ORAL_TABLET | ORAL | 3 refills | Status: DC

## 2021-02-09 NOTE — Patient Instructions (Signed)
Start Topamax daily for headache prevention. Take 25 mg (1 pill) at bedtime for one week, then increase to 50 mg (2 pills) at bedtime for one week, then increase to 75 mg (3 pills) at bedtime for one week, then increase to 100 mg (4 pills) at bedtime  Start Relpax 40 mg as needed or migraines. Take at the onset of migraine. If headache recurs or does not fully resolve, you may take a second dose after 2 hours. Please avoid taking more than 2 days per week or 10 days per month.

## 2021-02-09 NOTE — Progress Notes (Signed)
Referring:  Tally Joe, MD [phone] [place],  [place] [address]  PCP: Laurann Montana, MD  Neurology was asked to evaluate Leah Gonzalez, a 55 year old female for a chief complaint of headaches.  Our recommendations of care will be communicated by shared medical record.    CC:  headaches  HPI:  Medical co-morbidities: DM2, HLD, HTN  The patient presents for evaluation of headaches. She has had headaches ever since childhood but they have worsened this year. They were constant last month but have decreased a little in frequency since then. Currently she gets 3 headaches per week which can last 24-48 hours at a time. They are described as sharp and throbbing left frontal pain associated with blurred vision, photophobia, phonophobia, and nausea. Takes Tylenol 3 times per week for headaches which doesn't help much. She did decrease caffeine intake recently which seemed to help.  Headache History: Onset: Triggers: stress, foods Most common time of day for headache to begin: any time Aura: no Location: left Quality/Description: sharp, throbbing Severity: 8/10 Associated Symptoms:  Photophobia: yes  Phonophobia: yes  Nausea: yes Vomiting: no Worse with activity?: yes Duration of headaches: 1-2 days  Headache days per month: 15 Headache free days per month: 15  Current Treatment: Abortive Tylenol  Preventative none  Prior Therapies                                 Tylenol   Headache Risk Factors: Headache risk factors and/or co-morbidities (-) Neck Pain (-) Back Pain (-) History of Motor Vehicle Accident (+) Sleep Disorder - insomnia (-) Fibromyalgia (+) Obesity  Body mass index is 42.82 kg/m. (-) History of Traumatic Brain Injury and/or Concussion   LABS: CBC    Component Value Date/Time   WBC 8.5 07/03/2020 0953   RBC 4.52 07/03/2020 0953   HGB 11.8 (L) 07/03/2020 0953   HGB 12.2 11/08/2019 1406   HCT 37.0 07/03/2020 0953   HCT 39.0  11/08/2019 1406   PLT 361 07/03/2020 0953   PLT 326 11/08/2019 1406   MCV 81.9 07/03/2020 0953   MCV 81 11/08/2019 1406   MCH 26.1 07/03/2020 0953   MCHC 31.9 07/03/2020 0953   RDW 15.9 (H) 07/03/2020 0953   RDW 14.9 11/08/2019 1406   LYMPHSABS 2.6 11/08/2019 1406   MONOABS 0.7 03/16/2013 1740   EOSABS 0.1 11/08/2019 1406   BASOSABS 0.0 11/08/2019 1406   CMP Latest Ref Rng & Units 12/25/2020 07/03/2020 06/23/2020  Glucose 65 - 99 mg/dL 96 616(W) 79  BUN 6 - 24 mg/dL 15 17 12   Creatinine 0.57 - 1.00 mg/dL 7.37 1.06  Sodium 134 - 144 mmol/L 143 136 138  Potassium 3.5 - 5.2 mmol/L 4.3 4.4 4.1  Chloride 96 - 106 mmol/L 103 101 99  CO2 20 - 29 mmol/L 28 25 23   Calcium 8.7 - 10.2 mg/dL 9.3 9.5 9.0  Total Protein 6.0 - 8.5 g/dL 7.8 - 7.6  Total Bilirubin 0.0 - 1.2 mg/dL 0.3 - 0.2  Alkaline Phos 44 - 121 IU/L 78 - 88  AST 0 - 40 IU/L 14 - 15  ALT 0 - 32 IU/L 18 - 16     IMAGING:  MRI brain without contrast 08/23/20: unremarkable  Imaging independently reviewed on February 09, 2021   Current Outpatient Medications on File Prior to Visit  Medication Sig Dispense Refill   atorvastatin (LIPITOR) 10 MG  tablet Take 1 tablet (10 mg total) by mouth daily. 30 tablet 0   celecoxib (CELEBREX) 200 MG capsule Take 200 mg by mouth daily as needed (pain).   0   cyclobenzaprine (FLEXERIL) 10 MG tablet Take 10 mg by mouth daily as needed for muscle spasms.      dexlansoprazole (DEXILANT) 60 MG capsule Take 60 mg by mouth daily.     ferrous sulfate 325 (65 FE) MG tablet Take 325 mg by mouth daily.     meclizine (ANTIVERT) 25 MG tablet Take 1 tablet (25 mg total) by mouth 3 (three) times daily as needed for dizziness. 21 tablet 0   melatonin 3 MG TABS tablet Take 3 mg by mouth at bedtime as needed.     metFORMIN (GLUCOPHAGE) 500 MG tablet 1 tab by mouth daily with breakfast 30 tablet 0   Multiple Vitamin (MULTIVITAMIN WITH MINERALS) TABS tablet Take 1 tablet by mouth daily.     Probiotic Product  (PROBIOTIC PO) Take 1 tablet by mouth daily.     Semaglutide,0.25 or 0.5MG /DOS, (OZEMPIC, 0.25 OR 0.5 MG/DOSE,) 2 MG/1.5ML SOPN Inject 0.25 mg into the skin once a week. 1.5 mL 0   triamterene-hydrochlorothiazide (MAXZIDE-25) 37.5-25 MG tablet Take 1 tablet by mouth daily.  0   No current facility-administered medications on file prior to visit.     Allergies: Allergies  Allergen Reactions   Latex Itching    Family History: Migraine or other headaches in the family:  aunt Aneurysms in a first degree relative:  no Brain tumors in the family:  no Other neurological illness in the family:   no  Past Medical History: Past Medical History:  Diagnosis Date   Back pain    Dyslipidemia    Dyspnea    Gastritis and duodenitis    GERD (gastroesophageal reflux disease)    Goiter    Hypercholesterolemia    Iron deficiency anemia    Left hip pain    Lower extremity edema    Obesity     Past Surgical History Past Surgical History:  Procedure Laterality Date   ABDOMINAL HYSTERECTOMY  10/2009   CESAREAN SECTION     TUBAL LIGATION      Social History: Social History   Tobacco Use   Smoking status: Never   Smokeless tobacco: Never  Substance Use Topics   Alcohol use: No   Drug use: No    ROS: Negative for fevers, chills. Positive for headaches. All other systems reviewed and negative unless stated otherwise in HPI.   Physical Exam:   Vital Signs: BP 117/81   Pulse 76   Ht 4\' 11"  (1.499 m)   Wt 212 lb (96.2 kg)   BMI 42.82 kg/m  GENERAL: well appearing,in no acute distress,alert SKIN:  Color, texture, turgor normal. No rashes or lesions HEAD:  Normocephalic/atraumatic. CV:  RRR RESP: Normal respiratory effort MSK: no tenderness to palpation over occiput, neck, or shoulders  NEUROLOGICAL: Mental Status: Alert, oriented to person, place and time,Follows commands Cranial Nerves: PERRL,visual fields intact to confrontation,extraocular movements intact,facial  sensation intact,no facial droop or ptosis,hearing intact to finger rub bilaterally,no dysarthria,palate elevate symmetrically,tongue protrudes midline,shoulder shrug intact and symmetric Motor: muscle strength 5/5 both upper and lower extremities,no drift, normal tone Reflexes: 2+ throughout Sensation: intact to light touch all 4 extremities Coordination: Finger-to- nose-finger intact bilaterally,Heel-to-shin intact bilaterally Gait: normal-based   IMPRESSION: 55 year old female with a history of HTN, DM, HLD who presents for evaluation of chronic headaches. Her  headache pattern is most consistent with chronic migraine without aura. MRI brain and neurological exam are normal. Will start Topamax for headache prevention and Relpax for rescue. Will plan to check CMP in a few months as she is also taking metformin.   PLAN: -Preventive: Start Topamax 25 mg QHS, uptitrate by 25 mg weekly up to 100 mg QHS -Abortive: Start Relpax 40 mg PRN -check CMP in 3 months  Headache education was done. Discussed treatment options including preventive and acute medications, natural supplements, and physical therapy. Discussed medication overuse headache and to limit use of acute treatments to no more than 2 days/week or 10 days/month. Discussed medication side effects, adverse reactions and drug interactions. Written educational materials and patient instructions outlining all of the above were given.  Follow-up: 3 months   Ocie Doyne, MD 02/09/2021   3:28 PM

## 2021-02-20 ENCOUNTER — Other Ambulatory Visit (INDEPENDENT_AMBULATORY_CARE_PROVIDER_SITE_OTHER): Payer: Self-pay | Admitting: Family Medicine

## 2021-02-20 DIAGNOSIS — E7849 Other hyperlipidemia: Secondary | ICD-10-CM

## 2021-02-23 NOTE — Telephone Encounter (Signed)
LAST APPOINTMENT DATE: 01/29/21 NEXT APPOINTMENT DATE: 02/25/21   [id number] - [place], [place] - [address] AT [facility] [address] [place] [address] Phone: [phone] Fax: [phone]  Patient is requesting a refill of the following medications: Requested Prescriptions   Pending Prescriptions Disp Refills   atorvastatin (LIPITOR) 10 MG tablet [Pharmacy Med Name: ATORVASTATIN 10 MG TABLET] 30 tablet 0    Sig: TAKE 1 TABLET BY MOUTH EVERY DAY    Date last filled: 01/29/21 Previously prescribed by Dr. Dalbert Garnet  Lab Results  Component Value Date   HGBA1C 6.3 (H) 12/25/2020   HGBA1C 5.9 (H) 06/23/2020   HGBA1C 5.7 (H) 03/17/2020   Lab Results  Component Value Date   LDLCALC 152 (H) 12/25/2020   CREATININE 0.67 12/25/2020   Lab Results  Component Value Date   VD25OH 49.1 12/25/2020   VD25OH 68.2 06/23/2020   VD25OH 51.1 03/17/2020    BP Readings from Last 3 Encounters:  02/09/21 117/81  01/29/21 105/66  12/25/20 100/65

## 2021-02-23 NOTE — Telephone Encounter (Signed)
Will fill at her visit on Wed

## 2021-02-25 ENCOUNTER — Other Ambulatory Visit: Payer: Self-pay

## 2021-02-25 ENCOUNTER — Ambulatory Visit (INDEPENDENT_AMBULATORY_CARE_PROVIDER_SITE_OTHER): Payer: BC Managed Care – PPO | Admitting: Family Medicine

## 2021-02-25 ENCOUNTER — Encounter (INDEPENDENT_AMBULATORY_CARE_PROVIDER_SITE_OTHER): Payer: Self-pay | Admitting: Family Medicine

## 2021-02-25 VITALS — BP 100/66 | HR 81 | Temp 98.2°F | Ht 60.0 in | Wt 203.0 lb

## 2021-02-25 DIAGNOSIS — Z9189 Other specified personal risk factors, not elsewhere classified: Secondary | ICD-10-CM

## 2021-02-25 DIAGNOSIS — E1169 Type 2 diabetes mellitus with other specified complication: Secondary | ICD-10-CM | POA: Diagnosis not present

## 2021-02-25 DIAGNOSIS — G43909 Migraine, unspecified, not intractable, without status migrainosus: Secondary | ICD-10-CM | POA: Diagnosis not present

## 2021-02-25 DIAGNOSIS — Z6841 Body Mass Index (BMI) 40.0 and over, adult: Secondary | ICD-10-CM | POA: Diagnosis not present

## 2021-02-25 MED ORDER — METFORMIN HCL 500 MG PO TABS: ORAL_TABLET | ORAL | 0 refills | Status: DC

## 2021-02-25 MED ORDER — OZEMPIC (0.25 OR 0.5 MG/DOSE) 2 MG/1.5ML ~~LOC~~ SOPN: 0.2500 mg | PEN_INJECTOR | SUBCUTANEOUS | 0 refills | Status: DC

## 2021-02-26 NOTE — Progress Notes (Signed)
Chief Complaint:   OBESITY Leah Gonzalez is here to discuss her progress with her obesity treatment plan along with follow-up of her obesity related diagnoses. Leah Gonzalez is on keeping a food journal and adhering to recommended goals of 1200-1400 calories and 80+ grams of protein daily and states she is following her eating plan approximately 85% of the time. Leah Gonzalez states she is walking for 30 minutes 7 times per week.  Today's visit was #: 22 Starting weight: 227 lbs Starting date: 11/08/2019 Today's weight: 203 lbs Today's date: 02/25/2021 Total lbs lost to date: 24 Total lbs lost since last in-office visit: 6  Interim History: Leah Gonzalez is doing well with weight loss. She notes her hunger is significantly decreased and she may not be meeting all of her calorie goals.  Subjective:   1. Type 2 diabetes mellitus with other specified complication, without long-term current use of insulin (HCC) Leah Gonzalez is stable on Ozempic, and she is doing well with diet and weight loss.  2. Migraine without status migrainosus, not intractable, unspecified migraine type Leah Gonzalez was started on Topamax 100 mg and she notes significant fatigue.  3. At risk for impaired metabolic function Leah Gonzalez is at increased risk for impaired metabolic function if calories or protein decreases.  Assessment/Plan:   1. Type 2 diabetes mellitus with other specified complication, without long-term current use of insulin (HCC) We will refill metformin and Ozempic for 1 month. Leah Gonzalez will continue with diet and exercise. Good blood sugar control is important to decrease the likelihood of diabetic complications such as nephropathy, neuropathy, limb loss, blindness, coronary artery disease, and death. Intensive lifestyle modification including diet, exercise and weight loss are the first line of treatment for diabetes.   - metFORMIN (GLUCOPHAGE) 500 MG tablet; 1 tab by mouth daily with breakfast  Dispense: 30 tablet; Refill: 0 -  Semaglutide,0.25 or 0.5MG /DOS, (OZEMPIC, 0.25 OR 0.5 MG/DOSE,) 2 MG/1.5ML SOPN; Inject 0.25 mg into the skin once a week.  Dispense: 1.5 mL; Refill: 0  2. Migraine without status migrainosus, not intractable, unspecified migraine type Leah Gonzalez is to discussed the side effects of Topamax with her Neurologist.  3. At risk for impaired metabolic function Leah Gonzalez was given approximately 15 minutes of impaired  metabolic function prevention counseling today. We discussed intensive lifestyle modifications today with an emphasis on specific nutrition and exercise instructions and strategies.   Repetitive spaced learning was employed today to elicit superior memory formation and behavioral change.  4. Obesity with Current BMI 39.7 Leah Gonzalez is currently in the action stage of change. As such, her goal is to continue with weight loss efforts. She has agreed to keeping a food journal and adhering to recommended goals of 1200-1400 calories and 80+ grams of protein daily.   Exercise goals: As is.  Behavioral modification strategies: decreasing simple carbohydrates and meal planning and cooking strategies.  Leah Gonzalez has agreed to follow-up with our clinic in 3 weeks. She was informed of the importance of frequent follow-up visits to maximize her success with intensive lifestyle modifications for her multiple health conditions.   Objective:   Blood pressure 100/66, pulse 81, temperature 98.2 F (36.8 C), height 5' (1.524 m), weight 203 lb (92.1 kg), SpO2 99 %. Body mass index is 39.65 kg/m.  General: Cooperative, alert, well developed, in no acute distress. HEENT: Conjunctivae and lids unremarkable. Cardiovascular: Regular rhythm.  Lungs: Normal work of breathing. Neurologic: No focal deficits.   Lab Results  Component Value Date   CREATININE 0.67 12/25/2020  BUN 15 12/25/2020   NA 143 12/25/2020   K 4.3 12/25/2020   CL 103 12/25/2020   CO2 28 12/25/2020   Lab Results  Component Value Date    ALT 18 12/25/2020   AST 14 12/25/2020   ALKPHOS 78 12/25/2020   BILITOT 0.3 12/25/2020   Lab Results  Component Value Date   HGBA1C 6.3 (H) 12/25/2020   HGBA1C 5.9 (H) 06/23/2020   HGBA1C 5.7 (H) 03/17/2020   HGBA1C 6.5 (H) 11/08/2019   Lab Results  Component Value Date   INSULIN 18.4 12/25/2020   INSULIN 5.2 06/23/2020   INSULIN 7.5 03/17/2020   INSULIN 12.3 11/08/2019   Lab Results  Component Value Date   TSH 1.760 11/08/2019   Lab Results  Component Value Date   CHOL 212 (H) 12/25/2020   HDL 42 12/25/2020   LDLCALC 152 (H) 12/25/2020   TRIG 98 12/25/2020   CHOLHDL 3.7 06/23/2020   Lab Results  Component Value Date   VD25OH 49.1 12/25/2020   VD25OH 68.2 06/23/2020   VD25OH 51.1 03/17/2020   Lab Results  Component Value Date   WBC 8.5 07/03/2020   HGB 11.8 (L) 07/03/2020   HCT 37.0 07/03/2020   MCV 81.9 07/03/2020   PLT 361 07/03/2020   No results found for: IRON, TIBC, FERRITIN  Attestation Statements:   Reviewed by clinician on day of visit: allergies, medications, problem list, medical history, surgical history, family history, social history, and previous encounter notes.   I, Burt Knack, am acting as transcriptionist for Quillian Quince, MD.  I have reviewed the above documentation for accuracy and completeness, and I agree with the above. -  Quillian Quince, MD

## 2021-03-03 ENCOUNTER — Other Ambulatory Visit (INDEPENDENT_AMBULATORY_CARE_PROVIDER_SITE_OTHER): Payer: Self-pay | Admitting: Adult Health

## 2021-03-03 DIAGNOSIS — E1169 Type 2 diabetes mellitus with other specified complication: Secondary | ICD-10-CM

## 2021-03-18 ENCOUNTER — Encounter (INDEPENDENT_AMBULATORY_CARE_PROVIDER_SITE_OTHER): Payer: Self-pay | Admitting: Family Medicine

## 2021-03-18 ENCOUNTER — Ambulatory Visit (INDEPENDENT_AMBULATORY_CARE_PROVIDER_SITE_OTHER): Payer: BC Managed Care – PPO | Admitting: Family Medicine

## 2021-03-18 ENCOUNTER — Other Ambulatory Visit: Payer: Self-pay

## 2021-03-18 VITALS — BP 118/76 | HR 64 | Temp 97.7°F | Ht 60.0 in | Wt 207.0 lb

## 2021-03-18 DIAGNOSIS — Z6841 Body Mass Index (BMI) 40.0 and over, adult: Secondary | ICD-10-CM | POA: Diagnosis not present

## 2021-03-18 DIAGNOSIS — E785 Hyperlipidemia, unspecified: Secondary | ICD-10-CM

## 2021-03-18 DIAGNOSIS — E7849 Other hyperlipidemia: Secondary | ICD-10-CM

## 2021-03-18 DIAGNOSIS — E1169 Type 2 diabetes mellitus with other specified complication: Secondary | ICD-10-CM

## 2021-03-18 DIAGNOSIS — E66813 Obesity, class 3: Secondary | ICD-10-CM

## 2021-03-18 MED ORDER — ATORVASTATIN CALCIUM 10 MG PO TABS: 10.0000 mg | ORAL_TABLET | Freq: Every day | ORAL | 0 refills | Status: AC

## 2021-03-18 MED ORDER — OZEMPIC (0.25 OR 0.5 MG/DOSE) 2 MG/1.5ML ~~LOC~~ SOPN: 0.2500 mg | PEN_INJECTOR | SUBCUTANEOUS | 0 refills | Status: AC

## 2021-03-18 MED ORDER — METFORMIN HCL 500 MG PO TABS: ORAL_TABLET | ORAL | 0 refills | Status: DC

## 2021-03-18 NOTE — Progress Notes (Signed)
Chief Complaint:   OBESITY Leah Gonzalez is here to discuss her progress with her obesity treatment plan along with follow-up of her obesity related diagnoses. Leah Gonzalez is on keeping a food journal and adhering to recommended goals of 1200-1400 calories and 80 grams of protein and states she is following her eating plan approximately 85% of the time. Leah Gonzalez states she is doing 0 minutes 0 times per week.  Today's visit was #: 23 Starting weight: 227 lbs Starting date: 11/08/2019 Today's weight: 207 lbs Today's date: 03/18/2021 Total lbs lost to date: 20 lbs Total lbs lost since last in-office visit: 0  Interim History: Leah Gonzalez says her mother in law passed away recently and the availability of food made adherence to plan difficult. She has experienced increased stress which lead to emotional eating. She is now getting back to journaling. She reports lack of appetite. She tends to skip meals.   Subjective:   1. Type 2 diabetes mellitus with other specified complication, without long-term current use of insulin (HCC) Marie' s Type 2 diabetes mellitus is well controlled. Her last A1C was 6.5. She denies hypoglycemia and nausea. She does note constipation. She is on Ozempic and metformin.   Lab Results  Component Value Date   HGBA1C 6.3 (H) 12/25/2020   HGBA1C 5.9 (H) 06/23/2020   HGBA1C 5.7 (H) 03/17/2020   Lab Results  Component Value Date   LDLCALC 152 (H) 12/25/2020   CREATININE 0.67 12/25/2020   Lab Results  Component Value Date   INSULIN 18.4 12/25/2020   INSULIN 5.2 06/23/2020   INSULIN 7.5 03/17/2020   INSULIN 12.3 11/08/2019    2. Hyperlipidemia associated with type 2 DM Leah Gonzalez last LDL was above goal at 152. Her HDL was low at 42. Her Triglycerides were within normal limits. She is on Atorvastatin 10 mg.   Lab Results  Component Value Date   CHOL 212 (H) 12/25/2020   HDL 42 12/25/2020   LDLCALC 152 (H) 12/25/2020   TRIG 98 12/25/2020   CHOLHDL 3.7 06/23/2020    Lab Results  Component Value Date   ALT 18 12/25/2020   AST 14 12/25/2020   ALKPHOS 78 12/25/2020   BILITOT 0.3 12/25/2020   The 10-year ASCVD risk score (Arnett DK, et al., 2019) is: 6.7%   Values used to calculate the score:     Age: 55 years     Sex: Female     Is Non-Hispanic African American: Yes     Diabetic: Yes     Tobacco smoker: No     Systolic Blood Pressure: 118 mmHg     Is BP treated: No     HDL Cholesterol: 42 mg/dL     Total Cholesterol: 212 mg/dL   Assessment/Plan:   1. Type 2 diabetes mellitus with other specified complication, without long-term current use of insulin (HCC) We will refill Ozempic 0.25 mg weekly. She may take Miralax for constipation. We will refill Metformin 500 mg daily.  - metFORMIN (GLUCOPHAGE) 500 MG tablet; 1 tab by mouth daily with breakfast  Dispense: 30 tablet; Refill: 0 - Semaglutide,0.25 or 0.5MG /DOS, (OZEMPIC, 0.25 OR 0.5 MG/DOSE,) 2 MG/1.5ML SOPN; Inject 0.25 mg into the skin once a week.  Dispense: 1.5 mL; Refill: 0  2. Hyperlipidemia associated with type 2 DM Cardiovascular risk and specific lipid/LDL goals reviewed.  We discussed several lifestyle modifications today and Leah Gonzalez will continue to work on diet, exercise and weight loss efforts. We will refill Atorvastatin 10 mg daily for  1 month . We will consider increasing dose after next FLP results are reviewed.  She will consider working exercise into her day. - atorvastatin (LIPITOR) 10 MG tablet; Take 1 tablet (10 mg total) by mouth daily.  Dispense: 30 tablet; Refill: 0  3. Obesity with Current BMI 40.43 Leah Gonzalez is currently in the action stage of change. As such, her goal is to continue with weight loss efforts. She has agreed to keeping a food journal and adhering to recommended goals of 1200-1400 calories and 80 grams of protein daily.  Handouts:  Egg muffin recipes and protein content of food.  Discussed importance of regular food intake to maintain good metabolism and  meet protein goals.  Exercise goals: All adults should avoid inactivity. Some physical activity is better than none, and adults who participate in any amount of physical activity gain some health benefits.  Behavioral modification strategies: increasing lean protein intake and meal planning and cooking strategies.  Leah Gonzalez has agreed to follow-up with our clinic in 3 weeks with Dr. Dalbert Garnet.   Objective:   Blood pressure 118/76, pulse 64, temperature 97.7 F (36.5 C), height 5' (1.524 m), weight 207 lb (93.9 kg), SpO2 100 %. Body mass index is 40.43 kg/m.  General: Cooperative, alert, well developed, in no acute distress. HEENT: Conjunctivae and lids unremarkable. Cardiovascular: Regular rhythm.  Lungs: Normal work of breathing. Neurologic: No focal deficits.   Lab Results  Component Value Date   CREATININE 0.67 12/25/2020   BUN 15 12/25/2020   NA 143 12/25/2020   K 4.3 12/25/2020   CL 103 12/25/2020   CO2 28 12/25/2020   Lab Results  Component Value Date   ALT 18 12/25/2020   AST 14 12/25/2020   ALKPHOS 78 12/25/2020   BILITOT 0.3 12/25/2020   Lab Results  Component Value Date   HGBA1C 6.3 (H) 12/25/2020   HGBA1C 5.9 (H) 06/23/2020   HGBA1C 5.7 (H) 03/17/2020   HGBA1C 6.5 (H) 11/08/2019   Lab Results  Component Value Date   INSULIN 18.4 12/25/2020   INSULIN 5.2 06/23/2020   INSULIN 7.5 03/17/2020   INSULIN 12.3 11/08/2019   Lab Results  Component Value Date   TSH 1.760 11/08/2019   Lab Results  Component Value Date   CHOL 212 (H) 12/25/2020   HDL 42 12/25/2020   LDLCALC 152 (H) 12/25/2020   TRIG 98 12/25/2020   CHOLHDL 3.7 06/23/2020   Lab Results  Component Value Date   VD25OH 49.1 12/25/2020   VD25OH 68.2 06/23/2020   VD25OH 51.1 03/17/2020   Lab Results  Component Value Date   WBC 8.5 07/03/2020   HGB 11.8 (L) 07/03/2020   HCT 37.0 07/03/2020   MCV 81.9 07/03/2020   PLT 361 07/03/2020   No results found for: IRON, TIBC,  FERRITIN  Attestation Statements:   Reviewed by clinician on day of visit: allergies, medications, problem list, medical history, surgical history, family history, social history, and previous encounter notes.  I, Jackson Latino, RMA, am acting as Energy manager for [place], FNP.   I have reviewed the above documentation for accuracy and completeness, and I agree with the above. -  Jesse Sans, FNP

## 2021-03-18 NOTE — Progress Notes (Signed)
The 10-year ASCVD risk score (Arnett DK, et al., 2019) is: 6.7%   Values used to calculate the score:     Age: 55 years     Sex: Female     Is Non-Hispanic African American: Yes     Diabetic: Yes     Tobacco smoker: No     Systolic Blood Pressure: 118 mmHg     Is BP treated: No     HDL Cholesterol: 42 mg/dL     Total Cholesterol: 212 mg/dL

## 2021-04-08 ENCOUNTER — Ambulatory Visit (INDEPENDENT_AMBULATORY_CARE_PROVIDER_SITE_OTHER): Payer: BC Managed Care – PPO | Admitting: Family Medicine

## 2021-04-08 ENCOUNTER — Encounter (INDEPENDENT_AMBULATORY_CARE_PROVIDER_SITE_OTHER): Payer: Self-pay

## 2021-04-20 ENCOUNTER — Other Ambulatory Visit (INDEPENDENT_AMBULATORY_CARE_PROVIDER_SITE_OTHER): Payer: Self-pay | Admitting: Family Medicine

## 2021-04-20 DIAGNOSIS — E785 Hyperlipidemia, unspecified: Secondary | ICD-10-CM

## 2021-04-20 DIAGNOSIS — E1169 Type 2 diabetes mellitus with other specified complication: Secondary | ICD-10-CM

## 2021-04-20 NOTE — Telephone Encounter (Signed)
Pt last seen by Dawn Whitmire, FNP.  

## 2021-04-21 NOTE — Telephone Encounter (Signed)
Mychart message sent.

## 2021-05-01 ENCOUNTER — Other Ambulatory Visit: Payer: Self-pay | Admitting: Psychiatry

## 2021-05-05 ENCOUNTER — Other Ambulatory Visit: Payer: Self-pay

## 2021-05-05 ENCOUNTER — Encounter (INDEPENDENT_AMBULATORY_CARE_PROVIDER_SITE_OTHER): Payer: Self-pay | Admitting: Family Medicine

## 2021-05-05 ENCOUNTER — Ambulatory Visit (INDEPENDENT_AMBULATORY_CARE_PROVIDER_SITE_OTHER): Payer: BC Managed Care – PPO | Admitting: Family Medicine

## 2021-05-05 VITALS — BP 110/73 | HR 72 | Temp 98.1°F | Ht 60.0 in | Wt 206.0 lb

## 2021-05-05 DIAGNOSIS — E1169 Type 2 diabetes mellitus with other specified complication: Secondary | ICD-10-CM

## 2021-05-05 DIAGNOSIS — Z6841 Body Mass Index (BMI) 40.0 and over, adult: Secondary | ICD-10-CM

## 2021-05-05 DIAGNOSIS — E785 Hyperlipidemia, unspecified: Secondary | ICD-10-CM | POA: Diagnosis not present

## 2021-05-05 NOTE — Progress Notes (Addendum)
Chief Complaint:   OBESITY Leah Gonzalez is here to discuss her progress with her obesity treatment plan along with follow-up of her obesity related diagnoses. Leah Gonzalez is on the Category 2 Plan and keeping a food journal and adhering to recommended goals of 1200-1400 calories and 80 grams of protein and states she is following her eating plan approximately 85% of the time. Leah Gonzalez states she is doing physical therapy for 60 minutes 2 times per week.  Today's visit was #: 24 Starting weight: 227 lbs Starting date: 11/08/2019 Today's weight: 206 lbs Today's date: 05/05/2021 Total lbs lost to date: 21 lbs Total lbs lost since last in-office visit: 1 lb  Interim History: Leah Gonzalez feels she cannot continue to follow up every 3 weeks and may want to quit the program if she has to continue to come that frequently. She would like to come for follow up monthly.  Her hunger is satisfied. She is on Ozempic.  Subjective:   1. Type 2 diabetes mellitus with other specified complication, without long-term current use of insulin (HCC) Leah Gonzalez's Type 2 diabetes mellitus is well controlled. She is on Ozempic 0.5 mg and Metformin. She is tolerating Ozempic well. Her dose of Ozempic was increased recently to 0.5 mg weekly. She has noticed a decrease in appetite. She is taking Miralax for mild constipation.  Her CBGs range low 100s. She denies hypoglycemia.   Lab Results  Component Value Date   HGBA1C 6.3 (H) 12/25/2020   HGBA1C 5.9 (H) 06/23/2020   HGBA1C 5.7 (H) 03/17/2020   Lab Results  Component Value Date   LDLCALC 152 (H) 12/25/2020   CREATININE 0.67 12/25/2020   Lab Results  Component Value Date   INSULIN 18.4 12/25/2020   INSULIN 5.2 06/23/2020   INSULIN 7.5 03/17/2020   INSULIN 12.3 11/08/2019    2. Hyperlipidemia associated with type 2 diabetes mellitus (HCC) Leah Gonzalez's LDL is not at goal. Her LDL is 152. HDL is low at 42. She is on Lipitor 10 mg.   Lab Results  Component Value Date    CHOL 212 (H) 12/25/2020   HDL 42 12/25/2020   LDLCALC 152 (H) 12/25/2020   TRIG 98 12/25/2020   CHOLHDL 3.7 06/23/2020   Lab Results  Component Value Date   ALT 18 12/25/2020   AST 14 12/25/2020   ALKPHOS 78 12/25/2020   BILITOT 0.3 12/25/2020   The 10-year ASCVD risk score (Arnett DK, et al., 2019) is: 5.2%   Values used to calculate the score:     Age: 56 years     Sex: Female     Is Non-Hispanic African American: Yes     Diabetic: Yes     Tobacco smoker: No     Systolic Blood Pressure: 110 mmHg     Is BP treated: No     HDL Cholesterol: 42 mg/dL     Total Cholesterol: 212 mg/dL  Assessment/Plan:   1. Type 2 diabetes mellitus with other specified complication, without long-term current use of insulin (HCC) Leah Gonzalez will continue Ozempic and Metformin. We will check A1C next office visit. Possibly consider increasing Ozempic to 1 mg next office visit.   2. Hyperlipidemia associated with type 2 diabetes mellitus (HCC)  We will check FLP next office visit.   3. Obesity: Current BMI 40.23 Leah Gonzalez is currently in the action stage of change. As such, her goal is to continue with weight loss efforts. She has agreed to the Category 2 Plan or journaling individual meals  or complete day 1200-1400 calories and 80 grams of protein.   Calories and protein amounts provided for all meals. Antoine may follow up in 4-5 weeks vs every 3 weeks.   Exercise goals:  As is.  Behavioral modification strategies: increasing lean protein intake, decreasing simple carbohydrates, and meal planning and cooking strategies.  Leah Gonzalez has agreed to follow-up with our clinic in 4-5 weeks.  Objective:   Blood pressure 110/73, pulse 72, temperature 98.1 F (36.7 C), height 5' (1.524 m), weight 206 lb (93.4 kg), SpO2 99 %. Body mass index is 40.23 kg/m.  General: Cooperative, alert, well developed, in no acute distress. HEENT: Conjunctivae and lids unremarkable. Cardiovascular: Regular rhythm.   Lungs: Normal work of breathing. Neurologic: No focal deficits.   Lab Results  Component Value Date   CREATININE 0.67 12/25/2020   BUN 15 12/25/2020   NA 143 12/25/2020   K 4.3 12/25/2020   CL 103 12/25/2020   CO2 28 12/25/2020   Lab Results  Component Value Date   ALT 18 12/25/2020   AST 14 12/25/2020   ALKPHOS 78 12/25/2020   BILITOT 0.3 12/25/2020   Lab Results  Component Value Date   HGBA1C 6.3 (H) 12/25/2020   HGBA1C 5.9 (H) 06/23/2020   HGBA1C 5.7 (H) 03/17/2020   HGBA1C 6.5 (H) 11/08/2019   Lab Results  Component Value Date   INSULIN 18.4 12/25/2020   INSULIN 5.2 06/23/2020   INSULIN 7.5 03/17/2020   INSULIN 12.3 11/08/2019   Lab Results  Component Value Date   TSH 1.760 11/08/2019   Lab Results  Component Value Date   CHOL 212 (H) 12/25/2020   HDL 42 12/25/2020   LDLCALC 152 (H) 12/25/2020   TRIG 98 12/25/2020   CHOLHDL 3.7 06/23/2020   Lab Results  Component Value Date   VD25OH 49.1 12/25/2020   VD25OH 68.2 06/23/2020   VD25OH 51.1 03/17/2020   Lab Results  Component Value Date   WBC 8.5 07/03/2020   HGB 11.8 (L) 07/03/2020   HCT 37.0 07/03/2020   MCV 81.9 07/03/2020   PLT 361 07/03/2020   No results found for: IRON, TIBC, FERRITIN  Attestation Statements:   Reviewed by clinician on day of visit: allergies, medications, problem list, medical history, surgical history, family history, social history, and previous encounter notes.  I, Jackson Latino, RMA, am acting as Energy manager for [place], FNP.  I have reviewed the above documentation for accuracy and completeness, and I agree with the above. -  Jesse Sans, FNP

## 2021-05-06 ENCOUNTER — Encounter (INDEPENDENT_AMBULATORY_CARE_PROVIDER_SITE_OTHER): Payer: Self-pay | Admitting: Family Medicine

## 2021-05-13 ENCOUNTER — Other Ambulatory Visit: Payer: Self-pay

## 2021-05-13 ENCOUNTER — Encounter: Payer: Self-pay | Admitting: Psychiatry

## 2021-05-13 ENCOUNTER — Ambulatory Visit: Payer: BC Managed Care – PPO | Admitting: Psychiatry

## 2021-05-13 VITALS — BP 110/73 | HR 68 | Ht 59.0 in | Wt 211.0 lb

## 2021-05-13 DIAGNOSIS — G43009 Migraine without aura, not intractable, without status migrainosus: Secondary | ICD-10-CM | POA: Diagnosis not present

## 2021-05-13 MED ORDER — RIZATRIPTAN BENZOATE 10 MG PO TABS: 10.0000 mg | ORAL_TABLET | ORAL | 2 refills | Status: DC | PRN

## 2021-05-13 MED ORDER — DULOXETINE HCL 30 MG PO CPEP: ORAL_CAPSULE | ORAL | 3 refills | Status: DC

## 2021-05-13 MED ORDER — DULOXETINE HCL 60 MG PO CPEP: 60.0000 mg | ORAL_CAPSULE | Freq: Every day | ORAL | 3 refills | Status: DC

## 2021-05-13 NOTE — Patient Instructions (Addendum)
Start Cymbalta. Take 30 mg (1 pill) daily for one week, then increase to 60 mg (2 pills) daily for migraine prevention Start Maxalt 10 mg as needed for migraine. Take at the onset of migraine. If headache recurs or does not fully resolve, you may take a second dose after 2 hours. Please avoid taking more than 2 days per week  Try to limit Tylenol use to 2 days per week

## 2021-05-13 NOTE — Progress Notes (Signed)
° °  CC:  headaches  Follow-up Visit  Last visit: 02/09/21  Brief HPI: 56 year old female with a history of DM2, HLD, and HTN who follows in clinic for chronic migraines.  At her last visit she was started on Topamax for prevention and Relpax for rescue.  Interval History: Topamax caused brain fog and imbalance so she stopped taking it after 2 weeks. She is not taking anything for migraine prevention now. Currently she is having migraines twice per week which last 24-48 hours at a time.   Relpax 40 mg helped reduce her more than Tylenol, but did not fully resolve them.  Headache days per month: 8 Headache free days per month: 22  Current Headache Regimen: Preventative: none Abortive: Relpax 40 mg PRN  Prior Therapies                                  Tylenol Topamax - imbalance, brain fog Relpax 40 mg PRN  Physical Exam:   Vital Signs: BP 110/73    Pulse 68    Ht 4\' 11"  (1.499 m)    Wt 211 lb (95.7 kg)    BMI 42.62 kg/m  GENERAL:  well appearing, in no acute distress, alert  SKIN:  Color, texture, turgor normal. No rashes or lesions HEAD:  Normocephalic/atraumatic. RESP: normal respiratory effort MSK:  No gross joint deformities.   NEUROLOGICAL: Mental Status: Alert, oriented to person, place and time, Follows commands, and Speech fluent and appropriate. Cranial Nerves: PERRL, face symmetric, no dysarthria, hearing grossly intact Motor: moves all extremities equally Gait: normal-based.  IMPRESSION: 56 year old female with a history of HTN, DM, HLD who presents for follow up of chronic migraines. She was unable to tolerate Topamax and stopped it. Will start Cymbalta for migraine prevention. Relpax only mildly effective for rescue. Will switch to Maxalt and see if this provides more relief.  PLAN: -Prevention: Start Cymbalta 60 mg daily -Rescue: Stop Relpax. Start Maxalt 10 mg PRN -next steps: consider CGRP, botox  Follow-up: 3 months  I spent a total of 19 minutes  on the date of the service. Headache education was done. Discussed treatment options including preventive and acute medications. Discussed medication overuse headache and to limit use of acute treatments to no more than 2 days/week or 10 days/month. Discussed medication side effects, adverse reactions and drug interactions. Written educational materials and patient instructions outlining all of the above were given.  40, MD 05/13/21 3:01 PM

## 2021-06-02 ENCOUNTER — Ambulatory Visit (INDEPENDENT_AMBULATORY_CARE_PROVIDER_SITE_OTHER): Payer: BC Managed Care – PPO | Admitting: Family Medicine

## 2021-06-02 ENCOUNTER — Other Ambulatory Visit: Payer: Self-pay

## 2021-06-02 ENCOUNTER — Encounter (INDEPENDENT_AMBULATORY_CARE_PROVIDER_SITE_OTHER): Payer: Self-pay | Admitting: Family Medicine

## 2021-06-02 VITALS — BP 102/70 | HR 87 | Temp 97.8°F | Ht 60.0 in | Wt 204.0 lb

## 2021-06-02 DIAGNOSIS — E559 Vitamin D deficiency, unspecified: Secondary | ICD-10-CM

## 2021-06-02 DIAGNOSIS — E669 Obesity, unspecified: Secondary | ICD-10-CM | POA: Diagnosis not present

## 2021-06-02 DIAGNOSIS — E1169 Type 2 diabetes mellitus with other specified complication: Secondary | ICD-10-CM | POA: Diagnosis not present

## 2021-06-02 DIAGNOSIS — Z6839 Body mass index (BMI) 39.0-39.9, adult: Secondary | ICD-10-CM

## 2021-06-02 DIAGNOSIS — Z6841 Body Mass Index (BMI) 40.0 and over, adult: Secondary | ICD-10-CM

## 2021-06-02 NOTE — Progress Notes (Signed)
Chief Complaint:   OBESITY Leah Gonzalez is here to discuss her progress with her obesity treatment plan along with follow-up of her obesity related diagnoses. Leah Gonzalez is on the Category 2 Plan or keeping a food journal and adhering to recommended goals of 1200-1400 calories and 80 grams of protein and states she is following her eating plan approximately 80% of the time. Leah Gonzalez states she is doing 0 minutes 0 times per week.  Today's visit was #: 25 Starting weight: 227 lbs Starting date: 11/08/2019 Today's weight: 204 lbs Today's date: 06/02/2021 Total lbs lost to date: 23 lbs Total lbs lost since last in-office visit: 2 lb  Interim History: Leah Gonzalez is getting in adequate protein. She is drinking 1% [employer] milk rather than nonfat. She does not like skim. She is enjoying new breakfast option of protein oatmeal. She has a step tracker and averages 5000 steps daily. She has discovered 0 calorie tea that she likes.  Subjective:   1. Type 2 diabetes mellitus with other specified complication, without long-term current use of insulin (HCC) Well controlled. Leah Gonzalez's Ozempic dose was increased to 1 mg by primary care physician. Her A1C was 6.0 at primary care physician in December [facility]).. She denies hypoglycemia. She is on Metformin and Ozempic. Her CBGs run 110-112 two hour postprandial.   Lab Results  Component Value Date   HGBA1C 6.3 (H) 12/25/2020   HGBA1C 5.9 (H) 06/23/2020   HGBA1C 5.7 (H) 03/17/2020   Lab Results  Component Value Date   LDLCALC 152 (H) 12/25/2020   CREATININE 0.67 12/25/2020   Lab Results  Component Value Date   INSULIN 18.4 12/25/2020   INSULIN 5.2 06/23/2020   INSULIN 7.5 03/17/2020   INSULIN 12.3 11/08/2019    2. Vitamin D deficiency Leah Gonzalez Vitamin D is nearly at goal. She is on 5000 OTC Vitamin D daily.  Lab Results  Component Value Date   VD25OH 49.1 12/25/2020   VD25OH 68.2 06/23/2020   VD25OH 51.1 03/17/2020     Assessment/Plan:   1. Type 2 diabetes mellitus with other specified complication, without long-term current use of insulin ([place]) Leah Gonzalez will continue taking Ozempic and Metformin.   2. Vitamin D deficiency Leah Gonzalez will continue taking  OTC Vitamin D 5,000 IU daily and she will follow-up for routine testing of Vitamin D, at least 2-3 times per year to avoid over-replacement.  3. Obesity: Current BMI 39.84 Leah Gonzalez is currently in the action stage of change. As such, her goal is to continue with weight loss efforts. She has agreed to the Category 2 Plan or keeping a food journal and adhering to recommended goals of 1200-1400 calories and 80 grams of protein daily.    Exercise goals:  Leah Gonzalez will set a goal for 6,000 steps per day.  Behavioral modification strategies: decreasing simple carbohydrates and planning for success.  Leah Gonzalez has agreed to follow-up with our clinic in 4 weeks (Leah Gonzalez request) with Dr. Leafy Ro. Objective:   Blood pressure 102/70, pulse 87, temperature 97.8 F (36.6 C), height 5' (1.524 m), weight 204 lb (92.5 kg), SpO2 100 %. Body mass index is 39.84 kg/m.  General: Cooperative, alert, well developed, in no acute distress. HEENT: Conjunctivae and lids unremarkable. Cardiovascular: Regular rhythm.  Lungs: Normal work of breathing. Neurologic: No focal deficits.   Lab Results  Component Value Date   CREATININE 0.67 12/25/2020   BUN 15 12/25/2020   NA 143 12/25/2020   K 4.3 12/25/2020   CL 103 12/25/2020  CO2 28 12/25/2020   Lab Results  Component Value Date   ALT 18 12/25/2020   AST 14 12/25/2020   ALKPHOS 78 12/25/2020   BILITOT 0.3 12/25/2020   Lab Results  Component Value Date   HGBA1C 6.3 (H) 12/25/2020   HGBA1C 5.9 (H) 06/23/2020   HGBA1C 5.7 (H) 03/17/2020   HGBA1C 6.5 (H) 11/08/2019   Lab Results  Component Value Date   INSULIN 18.4 12/25/2020   INSULIN 5.2 06/23/2020   INSULIN 7.5 03/17/2020   INSULIN 12.3 11/08/2019   Lab  Results  Component Value Date   TSH 1.760 11/08/2019   Lab Results  Component Value Date   CHOL 212 (H) 12/25/2020   HDL 42 12/25/2020   LDLCALC 152 (H) 12/25/2020   TRIG 98 12/25/2020   CHOLHDL 3.7 06/23/2020   Lab Results  Component Value Date   VD25OH 49.1 12/25/2020   VD25OH 68.2 06/23/2020   VD25OH 51.1 03/17/2020   Lab Results  Component Value Date   WBC 8.5 07/03/2020   HGB 11.8 (L) 07/03/2020   HCT 37.0 07/03/2020   MCV 81.9 07/03/2020   PLT 361 07/03/2020   No results found for: IRON, TIBC, FERRITIN  Attestation Statements:   Reviewed by clinician on day of visit: allergies, medications, problem list, medical history, surgical history, family history, social history, and previous encounter notes.  I, Lizbeth Bark, RMA, am acting as Location manager for [employer], [place].  I have reviewed the above documentation for accuracy and completeness, and I agree with the above. -  Georgianne Fick, FNP

## 2021-06-03 ENCOUNTER — Ambulatory Visit: Payer: BC Managed Care – PPO | Admitting: Podiatry

## 2021-06-07 ENCOUNTER — Other Ambulatory Visit: Payer: Self-pay | Admitting: Psychiatry

## 2021-06-08 ENCOUNTER — Other Ambulatory Visit: Payer: Self-pay

## 2021-06-08 ENCOUNTER — Encounter: Payer: Self-pay | Admitting: Podiatry

## 2021-06-08 ENCOUNTER — Ambulatory Visit: Payer: BC Managed Care – PPO | Admitting: Podiatry

## 2021-06-08 DIAGNOSIS — R079 Chest pain, unspecified: Secondary | ICD-10-CM | POA: Insufficient documentation

## 2021-06-08 DIAGNOSIS — B351 Tinea unguium: Secondary | ICD-10-CM

## 2021-06-08 DIAGNOSIS — K219 Gastro-esophageal reflux disease without esophagitis: Secondary | ICD-10-CM | POA: Insufficient documentation

## 2021-06-08 DIAGNOSIS — E1169 Type 2 diabetes mellitus with other specified complication: Secondary | ICD-10-CM | POA: Diagnosis not present

## 2021-06-08 DIAGNOSIS — R609 Edema, unspecified: Secondary | ICD-10-CM | POA: Insufficient documentation

## 2021-06-08 DIAGNOSIS — M79674 Pain in right toe(s): Secondary | ICD-10-CM

## 2021-06-08 DIAGNOSIS — H401131 Primary open-angle glaucoma, bilateral, mild stage: Secondary | ICD-10-CM | POA: Insufficient documentation

## 2021-06-08 DIAGNOSIS — E7800 Pure hypercholesterolemia, unspecified: Secondary | ICD-10-CM | POA: Insufficient documentation

## 2021-06-08 DIAGNOSIS — K2990 Gastroduodenitis, unspecified, without bleeding: Secondary | ICD-10-CM | POA: Insufficient documentation

## 2021-06-08 DIAGNOSIS — D509 Iron deficiency anemia, unspecified: Secondary | ICD-10-CM | POA: Insufficient documentation

## 2021-06-08 DIAGNOSIS — E049 Nontoxic goiter, unspecified: Secondary | ICD-10-CM | POA: Insufficient documentation

## 2021-06-08 DIAGNOSIS — K5900 Constipation, unspecified: Secondary | ICD-10-CM | POA: Insufficient documentation

## 2021-06-08 DIAGNOSIS — R897 Abnormal histological findings in specimens from other organs, systems and tissues: Secondary | ICD-10-CM | POA: Insufficient documentation

## 2021-06-08 DIAGNOSIS — M79675 Pain in left toe(s): Secondary | ICD-10-CM | POA: Diagnosis not present

## 2021-06-08 DIAGNOSIS — E785 Hyperlipidemia, unspecified: Secondary | ICD-10-CM | POA: Insufficient documentation

## 2021-06-08 DIAGNOSIS — M5430 Sciatica, unspecified side: Secondary | ICD-10-CM | POA: Insufficient documentation

## 2021-06-08 NOTE — Progress Notes (Signed)
°  Subjective:  Patient ID: Leah Gonzalez, female    DOB: 1965/12/08,   MRN: [id number]  Chief Complaint  Patient presents with   Toe Pain       NP // Right great toe pain     56 y.o. female presents for concern of thickened elongated and painful nails that are difficult to trim. Requesting to have them trimmed today. Relates the right great toenail is ingrown. Denies any burning or tingling in her feet. Patient is diabetic and last A1c was 6.0   . Denies any other pedal complaints. Denies n/v/f/c.    PCP: Laurann Montana MD   Past Medical History:  Diagnosis Date   Back pain    Dyslipidemia    Dyspnea    Gastritis and duodenitis    GERD (gastroesophageal reflux disease)    Goiter    Hypercholesterolemia    Iron deficiency anemia    Left hip pain    Lower extremity edema    Obesity     Objective:  Physical Exam: Vascular: DP/PT pulses 2/4 bilateral. CFT <3 seconds. Normal hair growth on digits. No edema.  Skin. No lacerations or abrasions bilateral feet. Nails 1-5 are thickened discolored and elongated with subungual debris.  Musculoskeletal: MMT 5/5 bilateral lower extremities in DF, PF, Inversion and Eversion. Deceased ROM in DF of ankle joint.  Neurological: Sensation intact to light touch.   Assessment:   1. Type 2 diabetes mellitus with other specified complication, without long-term current use of insulin (HCC)   2. Pain due to onychomycosis of toenails of both feet      Plan:  Patient was evaluated and treated and all questions answered. -Discussed and educated patient on diabetic foot care, especially with  regards to the vascular, neurological and musculoskeletal systems.  -Stressed the importance of good glycemic control and the detriment of not  controlling glucose levels in relation to the foot. -Discussed supportive shoes at all times and checking feet regularly.  -Mechanically debrided all nails 1-5 bilateral using sterile nail nipper and filed with  dremel without incident  -Answered all patient questions -Patient to return  in 3 months for at risk foot care -Patient advised to call the office if any problems or questions arise in the meantime.   Louann Sjogren, DPM

## 2021-07-02 ENCOUNTER — Ambulatory Visit (INDEPENDENT_AMBULATORY_CARE_PROVIDER_SITE_OTHER): Payer: BC Managed Care – PPO | Admitting: Family Medicine

## 2021-07-02 ENCOUNTER — Other Ambulatory Visit: Payer: Self-pay

## 2021-07-02 ENCOUNTER — Encounter (INDEPENDENT_AMBULATORY_CARE_PROVIDER_SITE_OTHER): Payer: Self-pay | Admitting: Family Medicine

## 2021-07-02 VITALS — BP 94/66 | HR 74 | Temp 98.2°F | Ht 60.0 in | Wt 203.0 lb

## 2021-07-02 DIAGNOSIS — Z7985 Long-term (current) use of injectable non-insulin antidiabetic drugs: Secondary | ICD-10-CM

## 2021-07-02 DIAGNOSIS — Z6839 Body mass index (BMI) 39.0-39.9, adult: Secondary | ICD-10-CM | POA: Diagnosis not present

## 2021-07-02 DIAGNOSIS — E1169 Type 2 diabetes mellitus with other specified complication: Secondary | ICD-10-CM | POA: Diagnosis not present

## 2021-07-02 DIAGNOSIS — E669 Obesity, unspecified: Secondary | ICD-10-CM | POA: Diagnosis not present

## 2021-07-06 NOTE — Progress Notes (Signed)
? ? ? ?Chief Complaint:  ? ?OBESITY ?Leah Gonzalez is here to discuss her progress with her obesity treatment plan along with follow-up of her obesity related diagnoses. Leah Gonzalez is on the Category 2 Plan or keeping a food journal and adhering to recommended goals of 1200-1400 calories and 80 grams of protein daily and states she is following her eating plan approximately 75% of the time. Leah Gonzalez states she is walking for 30 minutes 5 times per week. ? ?Today's visit was #: 26 ?Starting weight: 227 lbs ?Starting date: 11/08/2019 ?Today's weight: 203 lbs ?Today's date: 07/02/2021 ?Total lbs lost to date: 24 ?Total lbs lost since last in-office visit: 1 ? ?Interim History: Leah Gonzalez continues to do  well with weight loss. Her hunger is controlled, but she has been feeling poorly lately and her appetite has decreased. ? ?Subjective:  ? ?1. Type 2 diabetes mellitus with other specified complication, without long-term current use of insulin ([place]) ?[name] is stable and her fasting and post prandial glucose ranges between 110-120 on an average. The highest was 154. Her last A1c 6.3. Dr. Juleen China increased Ozempic to 0.5 mg two months ago. She notes polyphagia is better controlled.  ? ?Assessment/Plan:  ? ?1. Type 2 diabetes mellitus with other specified complication, without long-term current use of insulin ([place]) ?Leah Gonzalez will continue with diet, exercise, and medications and will continue to monitor. Good blood sugar control is important to decrease the likelihood of diabetic complications such as nephropathy, neuropathy, limb loss, blindness, coronary artery disease, and death. Intensive lifestyle modification including diet, exercise and weight loss are the first line of treatment for diabetes.  ? ?2. Obesity with current BMI of 39.8 ?Leah Gonzalez is currently in the action stage of change. As such, her goal is to continue with weight loss efforts. She has agreed to the Category 2 Plan or keeping a food journal and adhering to  recommended goals of 1200-1400 calories and 80+ grams of protein daily.  ? ?We will recheck fasting labs at her next visit. ? ?Exercise goals: All adults should avoid inactivity. Some physical activity is better than none, and adults who participate in any amount of physical activity gain some health benefits. ? ?Behavioral modification strategies: increasing lean protein intake and no skipping meals. ? ?Leah Gonzalez has agreed to follow-up with our clinic in 4 weeks. She was informed of the importance of frequent follow-up visits to maximize her success with intensive lifestyle modifications for her multiple health conditions.  ? ?Objective:  ? ?Blood pressure 94/66, pulse 74, temperature 98.2 ?F (36.8 ?C), height 5' (1.524 m), weight 203 lb (92.1 kg), SpO2 99 %. ?Body mass index is 39.65 kg/m?. ? ?General: Cooperative, alert, well developed, in no acute distress. ?HEENT: Conjunctivae and lids unremarkable. ?Cardiovascular: Regular rhythm.  ?Lungs: Normal work of breathing. ?Neurologic: No focal deficits.  ? ?Lab Results  ?Component Value Date  ? CREATININE 0.67 12/25/2020  ? BUN 15 12/25/2020  ? NA 143 12/25/2020  ? K 4.3 12/25/2020  ? CL 103 12/25/2020  ? CO2 28 12/25/2020  ? ?Lab Results  ?Component Value Date  ? ALT 18 12/25/2020  ? AST 14 12/25/2020  ? ALKPHOS 78 12/25/2020  ? BILITOT 0.3 12/25/2020  ? ?Lab Results  ?Component Value Date  ? HGBA1C 6.3 (H) 12/25/2020  ? HGBA1C 5.9 (H) 06/23/2020  ? HGBA1C 5.7 (H) 03/17/2020  ? HGBA1C 6.5 (H) 11/08/2019  ? ?Lab Results  ?Component Value Date  ? INSULIN 18.4 12/25/2020  ? INSULIN 5.2 06/23/2020  ?  INSULIN 7.5 03/17/2020  ? INSULIN 12.3 11/08/2019  ? ?Lab Results  ?Component Value Date  ? TSH 1.760 11/08/2019  ? ?Lab Results  ?Component Value Date  ? CHOL 212 (H) 12/25/2020  ? HDL 42 12/25/2020  ? LDLCALC 152 (H) 12/25/2020  ? TRIG 98 12/25/2020  ? CHOLHDL 3.7 06/23/2020  ? ?Lab Results  ?Component Value Date  ? VD25OH 49.1 12/25/2020  ? VD25OH 68.2 06/23/2020  ?  VD25OH 51.1 03/17/2020  ? ?Lab Results  ?Component Value Date  ? WBC 8.5 07/03/2020  ? HGB 11.8 (L) 07/03/2020  ? HCT 37.0 07/03/2020  ? MCV 81.9 07/03/2020  ? PLT 361 07/03/2020  ? ?No results found for: IRON, TIBC, FERRITIN ? ?Attestation Statements:  ? ?Reviewed by clinician on day of visit: allergies, medications, problem list, medical history, surgical history, family history, social history, and previous encounter notes. ?Time spent on visit including pre-visit chart review and post-visit care and charting was 40 minutes.  ? ? ?I, Trixie Dredge, am acting as transcriptionist for Dennard Nip, MD. ? ?I have reviewed the above documentation for accuracy and completeness, and I agree with the above. -  Dennard Nip, MD ? ? ?

## 2021-08-04 ENCOUNTER — Ambulatory Visit (INDEPENDENT_AMBULATORY_CARE_PROVIDER_SITE_OTHER): Payer: BC Managed Care – PPO | Admitting: Family Medicine

## 2021-08-08 ENCOUNTER — Encounter (HOSPITAL_COMMUNITY): Payer: Self-pay

## 2021-08-08 ENCOUNTER — Other Ambulatory Visit: Payer: Self-pay

## 2021-08-08 ENCOUNTER — Emergency Department (HOSPITAL_COMMUNITY): Payer: BC Managed Care – PPO

## 2021-08-08 ENCOUNTER — Emergency Department (HOSPITAL_COMMUNITY)
Admission: EM | Admit: 2021-08-08 | Discharge: 2021-08-08 | Disposition: A | Discharge status: Home / Self Care | Payer: BC Managed Care – PPO | Attending: Emergency Medicine | Admitting: Emergency Medicine

## 2021-08-08 DIAGNOSIS — Z7984 Long term (current) use of oral hypoglycemic drugs: Secondary | ICD-10-CM | POA: Diagnosis not present

## 2021-08-08 DIAGNOSIS — R42 Dizziness and giddiness: Secondary | ICD-10-CM | POA: Insufficient documentation

## 2021-08-08 DIAGNOSIS — E119 Type 2 diabetes mellitus without complications: Secondary | ICD-10-CM | POA: Insufficient documentation

## 2021-08-08 DIAGNOSIS — R112 Nausea with vomiting, unspecified: Secondary | ICD-10-CM | POA: Diagnosis not present

## 2021-08-08 DIAGNOSIS — R61 Generalized hyperhidrosis: Secondary | ICD-10-CM | POA: Diagnosis not present

## 2021-08-08 DIAGNOSIS — R197 Diarrhea, unspecified: Secondary | ICD-10-CM | POA: Diagnosis not present

## 2021-08-08 DIAGNOSIS — Z91040 Latex allergy status: Secondary | ICD-10-CM | POA: Insufficient documentation

## 2021-08-08 DIAGNOSIS — R55 Syncope and collapse: Secondary | ICD-10-CM | POA: Diagnosis not present

## 2021-08-08 DIAGNOSIS — R1084 Generalized abdominal pain: Secondary | ICD-10-CM | POA: Insufficient documentation

## 2021-08-08 DIAGNOSIS — E876 Hypokalemia: Secondary | ICD-10-CM | POA: Diagnosis not present

## 2021-08-08 DIAGNOSIS — K219 Gastro-esophageal reflux disease without esophagitis: Secondary | ICD-10-CM | POA: Insufficient documentation

## 2021-08-08 HISTORY — DX: Type 2 diabetes mellitus without complications: E11.9

## 2021-08-08 LAB — COMPREHENSIVE METABOLIC PANEL
ALT: 19 U/L (ref 0–44)
AST: 20 U/L (ref 15–41)
Albumin: 3.9 g/dL (ref 3.5–5.0)
Alkaline Phosphatase: 72 U/L (ref 38–126)
Anion gap: 9 (ref 5–15)
BUN: 17 mg/dL (ref 6–20)
CO2: 27 mmol/L (ref 22–32)
Calcium: 9.6 mg/dL (ref 8.9–10.3)
Chloride: 106 mmol/L (ref 98–111)
Creatinine, Ser: 0.8 mg/dL (ref 0.44–1.00)
GFR, Estimated: >60 mL/min (ref >60)
Glucose, Bld: 107 mg/dL — ABNORMAL HIGH (ref 70–99)
Potassium: 3.2 mmol/L — ABNORMAL LOW (ref 3.5–5.1)
Sodium: 142 mmol/L (ref 135–145)
Total Bilirubin: 0.1 mg/dL — ABNORMAL LOW (ref 0.3–1.2)
Total Protein: 8.2 g/dL — ABNORMAL HIGH (ref 6.5–8.1)

## 2021-08-08 LAB — CBC WITH DIFFERENTIAL/PLATELET
Abs Immature Granulocytes: 0.02 10*3/uL (ref 0.00–0.07)
Basophils Absolute: 0 10*3/uL (ref 0.0–0.1)
Basophils Relative: 0 %
Eosinophils Absolute: 0.2 10*3/uL (ref 0.0–0.5)
Eosinophils Relative: 2 %
HCT: 36.8 % (ref 36.0–46.0)
Hemoglobin: 11.7 g/dL — ABNORMAL LOW (ref 12.0–15.0)
Immature Granulocytes: 0 %
Lymphocytes Relative: 41 %
Lymphs Abs: 3.9 10*3/uL (ref 0.7–4.0)
MCH: 26.4 pg (ref 26.0–34.0)
MCHC: 31.8 g/dL (ref 30.0–36.0)
MCV: 83.1 fL (ref 80.0–100.0)
Monocytes Absolute: 0.6 10*3/uL (ref 0.1–1.0)
Monocytes Relative: 7 %
Neutro Abs: 4.9 10*3/uL (ref 1.7–7.7)
Neutrophils Relative %: 50 %
Platelets: 359 10*3/uL (ref 150–400)
RBC: 4.43 MIL/uL (ref 3.87–5.11)
RDW: 15.1 % (ref 11.5–15.5)
WBC: 9.7 10*3/uL (ref 4.0–10.5)
nRBC: 0 % (ref 0.0–0.2)

## 2021-08-08 LAB — URINALYSIS, ROUTINE W REFLEX MICROSCOPIC
Bilirubin Urine: NEGATIVE
Glucose, UA: NEGATIVE mg/dL
Hgb urine dipstick: NEGATIVE
Ketones, ur: NEGATIVE mg/dL
Leukocytes,Ua: NEGATIVE
Nitrite: NEGATIVE
Protein, ur: NEGATIVE mg/dL
Specific Gravity, Urine: 1.02 (ref 1.005–1.030)
pH: 8 (ref 5.0–8.0)

## 2021-08-08 LAB — LIPASE, BLOOD: Lipase: 54 U/L — ABNORMAL HIGH (ref 11–51)

## 2021-08-08 MED ORDER — POTASSIUM CHLORIDE CRYS ER 20 MEQ PO TBCR
40.0000 meq | EXTENDED_RELEASE_TABLET | Freq: Once | ORAL | Status: AC
  Administered 2021-08-08: 40 meq via ORAL
  Filled 2021-08-08: qty 2

## 2021-08-08 MED ORDER — IOHEXOL 300 MG/ML  SOLN
100.0000 mL | Freq: Once | INTRAMUSCULAR | Status: AC | PRN
  Administered 2021-08-08: 100 mL via INTRAVENOUS

## 2021-08-08 MED ORDER — ONDANSETRON HCL 4 MG/2ML IJ SOLN
4.0000 mg | Freq: Once | INTRAMUSCULAR | Status: AC
  Administered 2021-08-08: 4 mg via INTRAVENOUS
  Filled 2021-08-08: qty 2

## 2021-08-08 MED ORDER — ONDANSETRON 4 MG PO TBDP: ORAL_TABLET | ORAL | 0 refills | Status: DC

## 2021-08-08 MED ORDER — SODIUM CHLORIDE (PF) 0.9 % IJ SOLN
INTRAMUSCULAR | Status: AC
  Filled 2021-08-08: qty 50

## 2021-08-08 MED ORDER — MORPHINE SULFATE (PF) 4 MG/ML IV SOLN
4.0000 mg | Freq: Once | INTRAVENOUS | Status: AC
  Administered 2021-08-08: 4 mg via INTRAVENOUS
  Filled 2021-08-08: qty 1

## 2021-08-08 MED ORDER — DICYCLOMINE HCL 20 MG PO TABS: 20.0000 mg | ORAL_TABLET | Freq: Three times a day (TID) | ORAL | 0 refills | Status: DC | PRN

## 2021-08-08 MED ORDER — SODIUM CHLORIDE 0.9 % IV BOLUS
1000.0000 mL | Freq: Once | INTRAVENOUS | Status: AC
Start: 2021-08-08 — End: 2021-08-08
  Administered 2021-08-08: 1000 mL via INTRAVENOUS

## 2021-08-08 NOTE — ED Notes (Signed)
Patient transported to CT 

## 2021-08-08 NOTE — Discharge Instructions (Addendum)
Your work-up today has been reassuring, labs and imaging overall look good, your potassium was a bit low and you were given potassium replacement today, please have this rechecked with your PCP.  I suspect your symptoms are likely due to a viral GI bug or foodborne illness.  This should be short-lived and resolve over the next few days.  You can use prescribed Zofran as needed for nausea and vomiting Bentyl as needed for cramping abdominal pain.  Follow recommendations for food choices to help relieve diarrhea.  Follow-up with your PCP and return for new or worsening symptoms. ? ? ?

## 2021-08-08 NOTE — ED Notes (Signed)
Pt verbalized resolution of pain and nausea ?

## 2021-08-08 NOTE — ED Notes (Signed)
Discharge instructions including prescription and follow up care discussed with pt. Pt verbalized understanding with no questions at this time. Pt to go home with significant other at bedside.  ?

## 2021-08-08 NOTE — ED Notes (Signed)
Pt able to ambulate to and from the bathroom independently. Urine sample collected and sent to main lab ?

## 2021-08-08 NOTE — ED Provider Notes (Signed)
?[facility]-EMERGENCY DEPT ?Provider Note ? ? ?CSN: [id number] ?Arrival date & time: 08/08/21  1937 ? ?  ? ?History ? ?Chief Complaint  ?Patient presents with  ? Abdominal Pain  ? ? ?Leah Gonzalez is a 56 y.o. female. ? ?Leah Gonzalez is a 56 y.o. female with a history of diabetes, hyperlipidemia, GERD, gastritis, back pain, who presents to the emergency department for evaluation of abdominal pain, nausea, vomiting, diarrhea and lightheadedness.  Patient reports that abdominal pain and diarrhea started earlier today.  Patient reports that she started having an episode of stomach cramping and went to the bathroom to try and have a bowel movement and suddenly got very lightheaded, nauseated and diaphoretic.  Patient reports that she did not lose consciousness or fall to the ground, and when EMS arrived the symptoms were resolving.  She reports some generalized abdominal pain and 2 episodes of nonbloody diarrhea as well as nausea.  She denies dysuria or urinary frequency.  No vaginal discharge or bleeding.  No one else at home is experiencing similar symptoms.  No known sick contacts.  No fevers or chills.  No other aggravating or alleviating factors. ? ?The history is provided by the patient and the spouse.  ? ?  ? ?Home Medications ?Prior to Admission medications   ?Medication Sig Start Date End Date Taking? Authorizing Provider  ?atorvastatin (LIPITOR) 10 MG tablet Take 1 tablet (10 mg total) by mouth daily. 03/18/21  Yes Whitmire, Thermon Leyland, FNP  ?dexlansoprazole (DEXILANT) 60 MG capsule Take 60 mg by mouth daily.   Yes [provider]  ?dicyclomine (BENTYL) 20 MG tablet Take 1 tablet (20 mg total) by mouth 3 (three) times daily as needed for spasms. 08/08/21  Yes Dartha Lodge, PA-C  ?ferrous sulfate 325 (65 FE) MG tablet Take 325 mg by mouth daily. 12/23/20  Yes [provider]  ?LUMIGAN 0.01 % SOLN 1 drop at bedtime. 05/19/21  Yes [provider]  ?meclizine  (ANTIVERT) 25 MG tablet Take 1 tablet (25 mg total) by mouth 3 (three) times daily as needed for dizziness. 07/03/20  Yes Robinson, [place] N, PA-C  ?melatonin 3 MG TABS tablet Take 3 mg by mouth at bedtime as needed (For sleep).   Yes [provider]  ?metFORMIN (GLUCOPHAGE) 500 MG tablet 1 tab by mouth daily with breakfast ?Patient taking differently: Take 500 mg by mouth daily with breakfast. 03/18/21  Yes Whitmire, Thermon Leyland, FNP  ?Multiple Vitamin (MULTIVITAMIN WITH MINERALS) TABS tablet Take 1 tablet by mouth daily.   Yes [provider]  ?naproxen (NAPROSYN) 500 MG tablet Take 500 mg by mouth 2 (two) times daily as needed for mild pain. 05/12/21  Yes [provider]  ?ondansetron (ZOFRAN-ODT) 4 MG disintegrating tablet 4mg  ODT q4 hours prn nausea/vomit 08/08/21  Yes 10/08/21, PA-C  ?rizatriptan (MAXALT) 10 MG tablet Take 1 tablet (10 mg total) by mouth as needed for migraine. May repeat in 2 hours if needed ?Patient taking differently: Take 10 mg by mouth daily as needed for migraine. 05/13/21  Yes 07/11/21, MD  ?Semaglutide,0.25 or 0.5MG /DOS, (OZEMPIC, 0.25 OR 0.5 MG/DOSE,) 2 MG/1.5ML SOPN Inject 0.25 mg into the skin once a week. ?Patient taking differently: Inject 0.5 mg into the skin once a week. 03/18/21  Yes Whitmire, 03/20/21, FNP  ?triamterene-hydrochlorothiazide (MAXZIDE-25) 37.5-25 MG tablet Take 1 tablet by mouth daily. 12/06/17  Yes [provider]  ?DULoxetine (CYMBALTA) 30 MG capsule Take 2 capsules (  60 mg) daily ?Patient not taking: Reported on 08/08/2021 06/08/21   Ocie Doynehima, Jennifer, MD  ?topiramate (TOPAMAX) 25 MG tablet Take 100 mg (4 pills) at bedtime ?Patient not taking: Reported on 08/08/2021 05/05/21   Ocie Doynehima, Jennifer, MD  ?   ? ?Allergies    ?Topiramate and Latex   ? ?Review of Systems   ?Review of Systems  ?Constitutional:  Negative for chills and fever.  ?HENT: Negative.    ?Respiratory:  Negative for cough and shortness of breath.   ?Cardiovascular:  Negative  for chest pain.  ?Gastrointestinal:  Positive for abdominal pain, diarrhea and nausea. Negative for vomiting.  ?Neurological:  Positive for light-headedness. Negative for syncope.  ?All other systems reviewed and are negative. ? ?Physical Exam ?Updated Vital Signs ?BP 97/61   Pulse 65   Temp 98.1 ?F (36.7 ?C) (Oral)   Resp 14   Ht 4\' 11"  (1.499 m)   Wt 94.3 kg   SpO2 98%   BMI 42.01 kg/m?  ?Physical Exam ?Vitals and nursing note reviewed.  ?Constitutional:   ?   General: She is not in acute distress. ?   Appearance: Normal appearance. She is well-developed. She is not ill-appearing or diaphoretic.  ?HENT:  ?   Head: Normocephalic and atraumatic.  ?   Mouth/Throat:  ?   Mouth: Mucous membranes are moist.  ?   Pharynx: Oropharynx is clear.  ?Eyes:  ?   General:     ?   Right eye: No discharge.     ?   Left eye: No discharge.  ?Cardiovascular:  ?   Rate and Rhythm: Normal rate and regular rhythm.  ?   Pulses: Normal pulses.  ?   Heart sounds: Normal heart sounds.  ?Pulmonary:  ?   Effort: Pulmonary effort is normal. No respiratory distress.  ?   Breath sounds: Normal breath sounds. No wheezing or rales.  ?   Comments: Respirations equal and unlabored, patient able to speak in full sentences, lungs clear to auscultation bilaterally  ?Abdominal:  ?   General: Bowel sounds are normal. There is no distension.  ?   Palpations: Abdomen is soft. There is no mass.  ?   Tenderness: There is abdominal tenderness. There is no guarding.  ?   Comments: Abdomen soft, nondistended, there is generalized tenderness noted throughout the abdomen with slight guarding, no CVA tenderness  ?Musculoskeletal:     ?   General: No deformity.  ?   Cervical back: Neck supple.  ?Skin: ?   General: Skin is warm and dry.  ?   Capillary Refill: Capillary refill takes less than 2 seconds.  ?Neurological:  ?   Mental Status: She is alert and oriented to person, place, and time.  ?   Coordination: Coordination normal.  ?   Comments: Speech is  clear, able to follow commands ?Moves extremities without ataxia, coordination intact  ?Psychiatric:     ?   Mood and Affect: Mood normal.     ?   Behavior: Behavior normal.  ? ? ?ED Results / Procedures / Treatments   ?Labs ?(all labs ordered are listed, but only abnormal results are displayed) ?Labs Reviewed  ?COMPREHENSIVE METABOLIC PANEL - Abnormal; Notable for the following components:  ?    Result Value  ? Potassium 3.2 (*)   ? Glucose, Bld 107 (*)   ? Total Protein 8.2 (*)   ? Total Bilirubin 0.1 (*)   ? All other components within normal limits  ?CBC  WITH DIFFERENTIAL/PLATELET - Abnormal; Notable for the following components:  ? Hemoglobin 11.7 (*)   ? All other components within normal limits  ?LIPASE, BLOOD - Abnormal; Notable for the following components:  ? Lipase 54 (*)   ? All other components within normal limits  ?URINALYSIS, ROUTINE W REFLEX MICROSCOPIC  ? ? ?EKG ?None ? ?Radiology ?CT Abdomen Pelvis W Contrast ? ?Result Date: 08/08/2021 ?CLINICAL DATA:  Right lower quadrant pain EXAM: CT ABDOMEN AND PELVIS WITH CONTRAST TECHNIQUE: Multidetector CT imaging of the abdomen and pelvis was performed using the standard protocol following bolus administration of intravenous contrast. RADIATION DOSE REDUCTION: This exam was performed according to the departmental dose-optimization program which includes automated exposure control, adjustment of the mA and/or kV according to patient size and/or use of iterative reconstruction technique. CONTRAST:  OMNIPAQUE IOHEXOL 300 MG/ML  SOLN COMPARISON:  11/10/2018 FINDINGS: Lower chest: Mild scarring is noted in the bases bilaterally. Hepatobiliary: No focal liver abnormality is seen. No gallstones, gallbladder wall thickening, or biliary dilatation. Pancreas: Unremarkable. No pancreatic ductal dilatation or surrounding inflammatory changes. Spleen: Normal in size without focal abnormality. Adrenals/Urinary Tract: Adrenal glands are within normal limits. Kidneys  demonstrate a normal enhancement pattern bilaterally. No renal calculi orObstructive changes are seen. The bladder is within normal limits. Stomach/Bowel: The appendix is not well visualized. No obstructive or inflamm

## 2021-08-08 NOTE — ED Triage Notes (Signed)
Pt from home via EMS c/o abdominal pain and diarrhea that started today. EMS was called out for "breathing problems" initially, but state that they believe pt had a vasovagal episode while on the toilet with a near syncopal episode. EMS reports upon their arrival, pt was tachypneic, diaphoretic, dizzy, nauseated. EMS reports they were able to help patient resolve most of those issues and afterward, pt was c/o generalized abdominal pain and some nausea. Pt reports diarrhea x2 episodes today. Pt A&Ox4, VSS upon arrival. EMS reports normal 12-lead EKG. ?

## 2021-08-12 ENCOUNTER — Encounter (INDEPENDENT_AMBULATORY_CARE_PROVIDER_SITE_OTHER): Payer: Self-pay | Admitting: Family Medicine

## 2021-08-12 ENCOUNTER — Ambulatory Visit (INDEPENDENT_AMBULATORY_CARE_PROVIDER_SITE_OTHER): Payer: BC Managed Care – PPO | Admitting: Family Medicine

## 2021-08-12 VITALS — BP 102/68 | HR 75 | Temp 97.3°F | Ht 60.0 in | Wt 205.0 lb

## 2021-08-12 DIAGNOSIS — E669 Obesity, unspecified: Secondary | ICD-10-CM

## 2021-08-12 DIAGNOSIS — Z6841 Body Mass Index (BMI) 40.0 and over, adult: Secondary | ICD-10-CM

## 2021-08-12 DIAGNOSIS — E1169 Type 2 diabetes mellitus with other specified complication: Secondary | ICD-10-CM

## 2021-08-12 DIAGNOSIS — Z7985 Long-term (current) use of injectable non-insulin antidiabetic drugs: Secondary | ICD-10-CM | POA: Diagnosis not present

## 2021-08-14 NOTE — Progress Notes (Signed)
? ? ? ?Chief Complaint:  ? ?OBESITY ?Leah Gonzalez is here to discuss her progress with her obesity treatment plan along with follow-up of her obesity related diagnoses. [name] is on the Category 2 Plan or keeping a food journal and adhering to recommended goals of 1200-1400 calories and 80+ grams of protein daily and states she is following her eating plan approximately 75% of the time. [name] states she is walking for 30 minutes 2 times per week. ? ?Today's visit was #: 27 ?Starting weight: 227 lbs ?Starting date: 11/08/2019 ?Today's weight: 205 lbs ?Today's date: 08/12/2021 ?Total lbs lost to date: 22 ?Total lbs lost since last in-office visit: 0 ? ?Interim History: [name] has struggled a bit with her weight loss efforts over the last few weeks, but she is working on getting back on track. She is open to looking at other meal plans. ? ?Subjective:  ? ?1. Type 2 diabetes mellitus with other specified complication, without long-term current use of insulin ([facility]) ?Nioma increased Ozempic to 1 mg last weekend. She notes decreased polyphagia, but denies nausea or vomiting.  ? ?Assessment/Plan:  ? ?1. Type 2 diabetes mellitus with other specified complication, without long-term current use of insulin ([facility]) ?[name] will continue Ozempic at 1 mg. She was warned to not skip meals as this will decrease her RMR and hinder her long term weight loss.  ? ?2. Obesity with current BMI of 40.0 ?[name] is currently in the action stage of change. As such, her goal is to continue with weight loss efforts. She has agreed to change to following a lower carbohydrate, vegetable and lean protein rich diet plan.  ? ?Exercise goals: As is. ? ?Behavioral modification strategies: increasing lean protein intake. ? ?[name] has agreed to follow-up with our clinic in 3 weeks. She was informed of the importance of frequent follow-up visits to maximize her success with intensive lifestyle modifications for her multiple health conditions.   ? ?Objective:  ? ?Blood pressure 102/68, pulse 75, temperature (!) 97.3 ?F (36.3 ?C), height 5' (1.524 m), weight 205 lb (93 kg), SpO2 93 %. ?Body mass index is 40.04 kg/m?. ? ?General: Cooperative, alert, well developed, in no acute distress. ?HEENT: Conjunctivae and lids unremarkable. ?Cardiovascular: Regular rhythm.  ?Lungs: Normal work of breathing. ?Neurologic: No focal deficits.  ? ?Lab Results  ?Component Value Date  ? CREATININE 0.80 08/08/2021  ? BUN 17 08/08/2021  ? NA 142 08/08/2021  ? K 3.2 (L) 08/08/2021  ? CL 106 08/08/2021  ? CO2 27 08/08/2021  ? ?Lab Results  ?Component Value Date  ? ALT 19 08/08/2021  ? AST 20 08/08/2021  ? ALKPHOS 72 08/08/2021  ? BILITOT 0.1 (L) 08/08/2021  ? ?Lab Results  ?Component Value Date  ? HGBA1C 6.3 (H) 12/25/2020  ? HGBA1C 5.9 (H) 06/23/2020  ? HGBA1C 5.7 (H) 03/17/2020  ? HGBA1C 6.5 (H) 11/08/2019  ? ?Lab Results  ?Component Value Date  ? INSULIN 18.4 12/25/2020  ? INSULIN 5.2 06/23/2020  ? INSULIN 7.5 03/17/2020  ? INSULIN 12.3 11/08/2019  ? ?Lab Results  ?Component Value Date  ? TSH 1.760 11/08/2019  ? ?Lab Results  ?Component Value Date  ? CHOL 212 (H) 12/25/2020  ? HDL 42 12/25/2020  ? LDLCALC 152 (H) 12/25/2020  ? TRIG 98 12/25/2020  ? CHOLHDL 3.7 06/23/2020  ? ?Lab Results  ?Component Value Date  ? VD25OH 49.1 12/25/2020  ? VD25OH 68.2 06/23/2020  ? VD25OH 51.1 03/17/2020  ? ?Lab Results  ?Component Value Date  ?  WBC 9.7 08/08/2021  ? HGB 11.7 (L) 08/08/2021  ? HCT 36.8 08/08/2021  ? MCV 83.1 08/08/2021  ? PLT 359 08/08/2021  ? ?No results found for: IRON, TIBC, FERRITIN ? ?Attestation Statements:  ? ?Reviewed by clinician on day of visit: allergies, medications, problem list, medical history, surgical history, family history, social history, and previous encounter notes. ? ?Time spent on visit including pre-visit chart review and post-visit care and charting was 30 minutes.  ? ? ?I, Burt Knack, am acting as transcriptionist for Quillian Quince, MD. ? ?I have  reviewed the above documentation for accuracy and completeness, and I agree with the above. -  Quillian Quince, MD ? ? ?

## 2021-09-10 ENCOUNTER — Ambulatory Visit (INDEPENDENT_AMBULATORY_CARE_PROVIDER_SITE_OTHER): Payer: BC Managed Care – PPO | Admitting: Family Medicine

## 2021-09-26 IMAGING — DX DG CHEST 2V
2 series · 2 of 2 positions shown · non-contrast
Comparison: 11/28/2018

CLINICAL DATA: Syncope

EXAM:
CHEST - 2 VIEW

[chest pa]
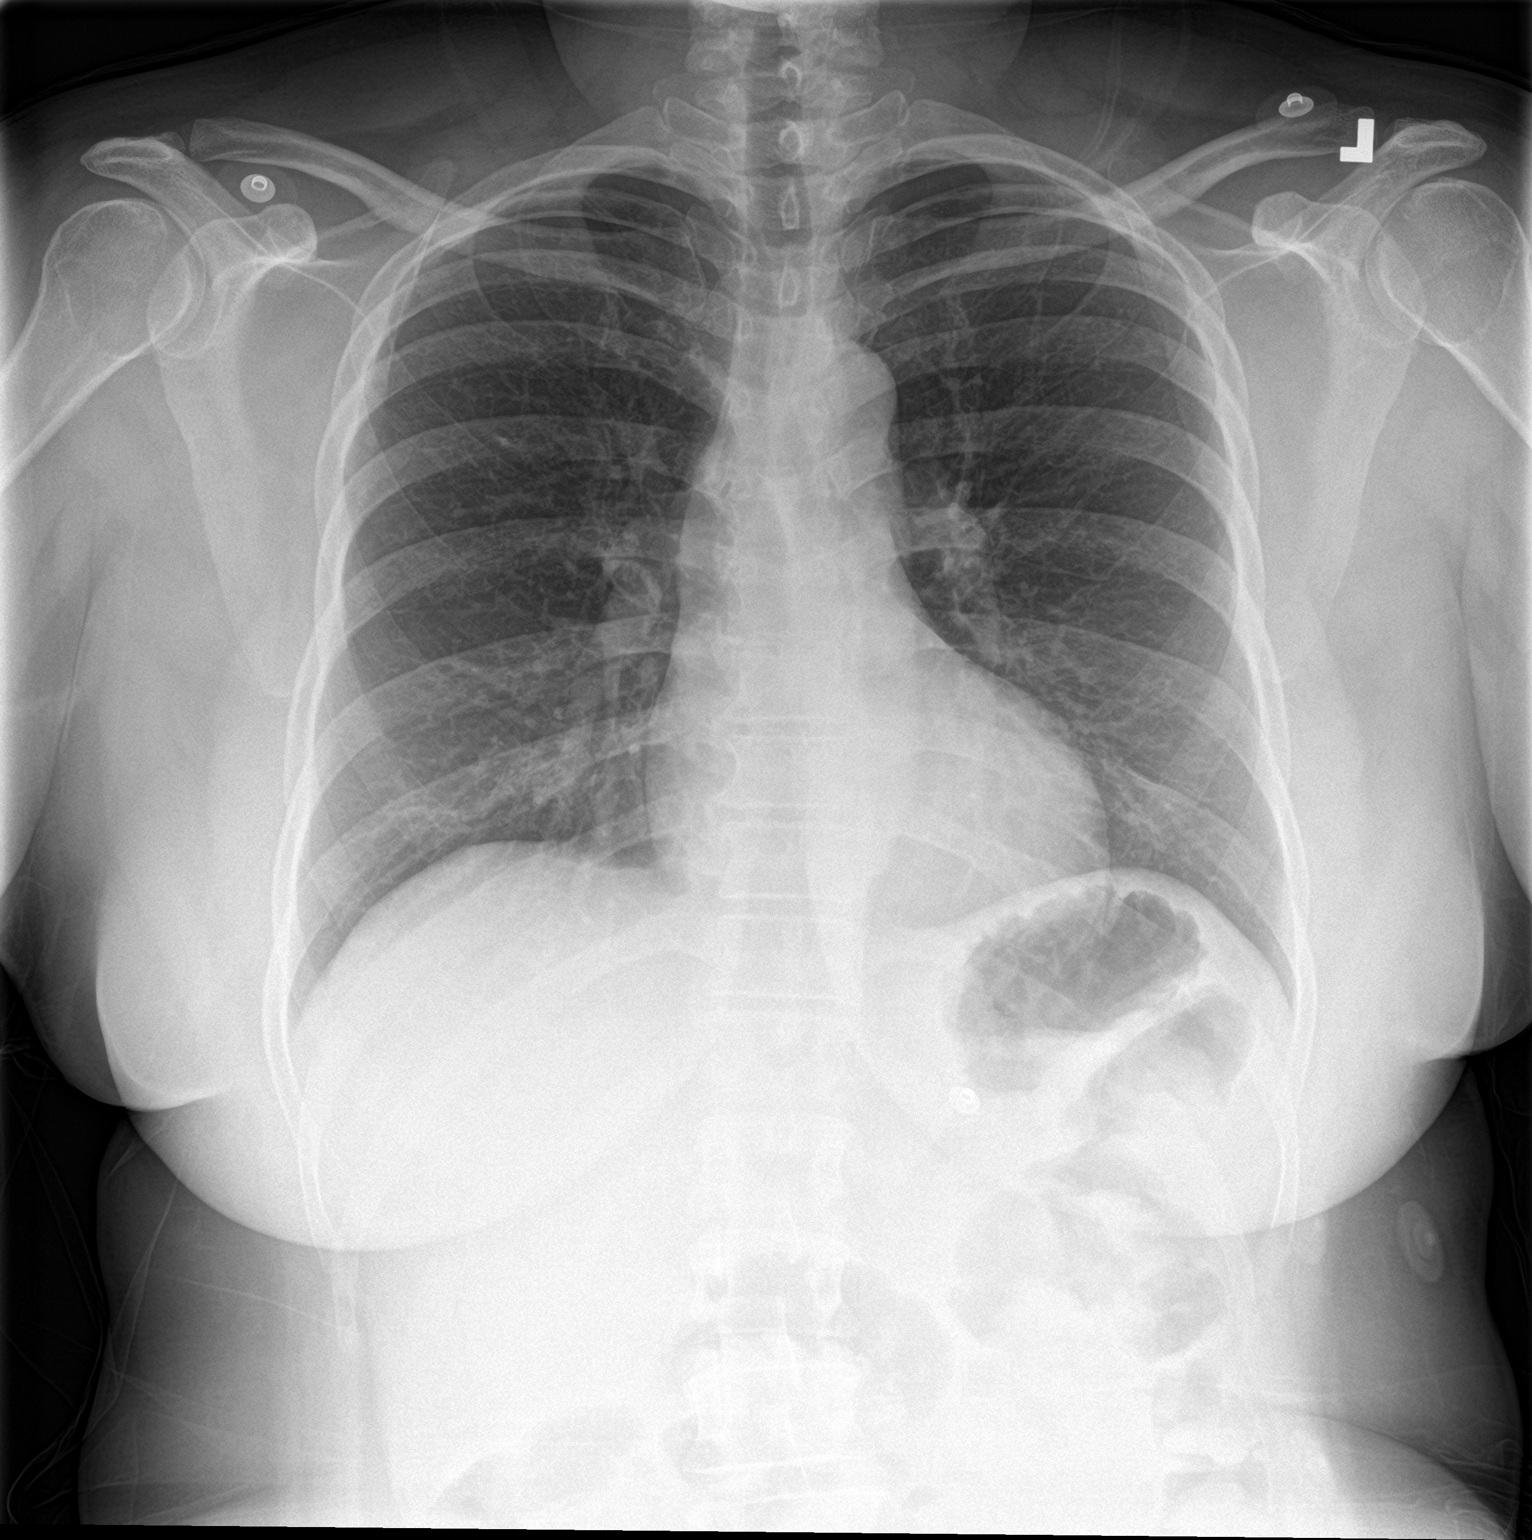

[chest lat]
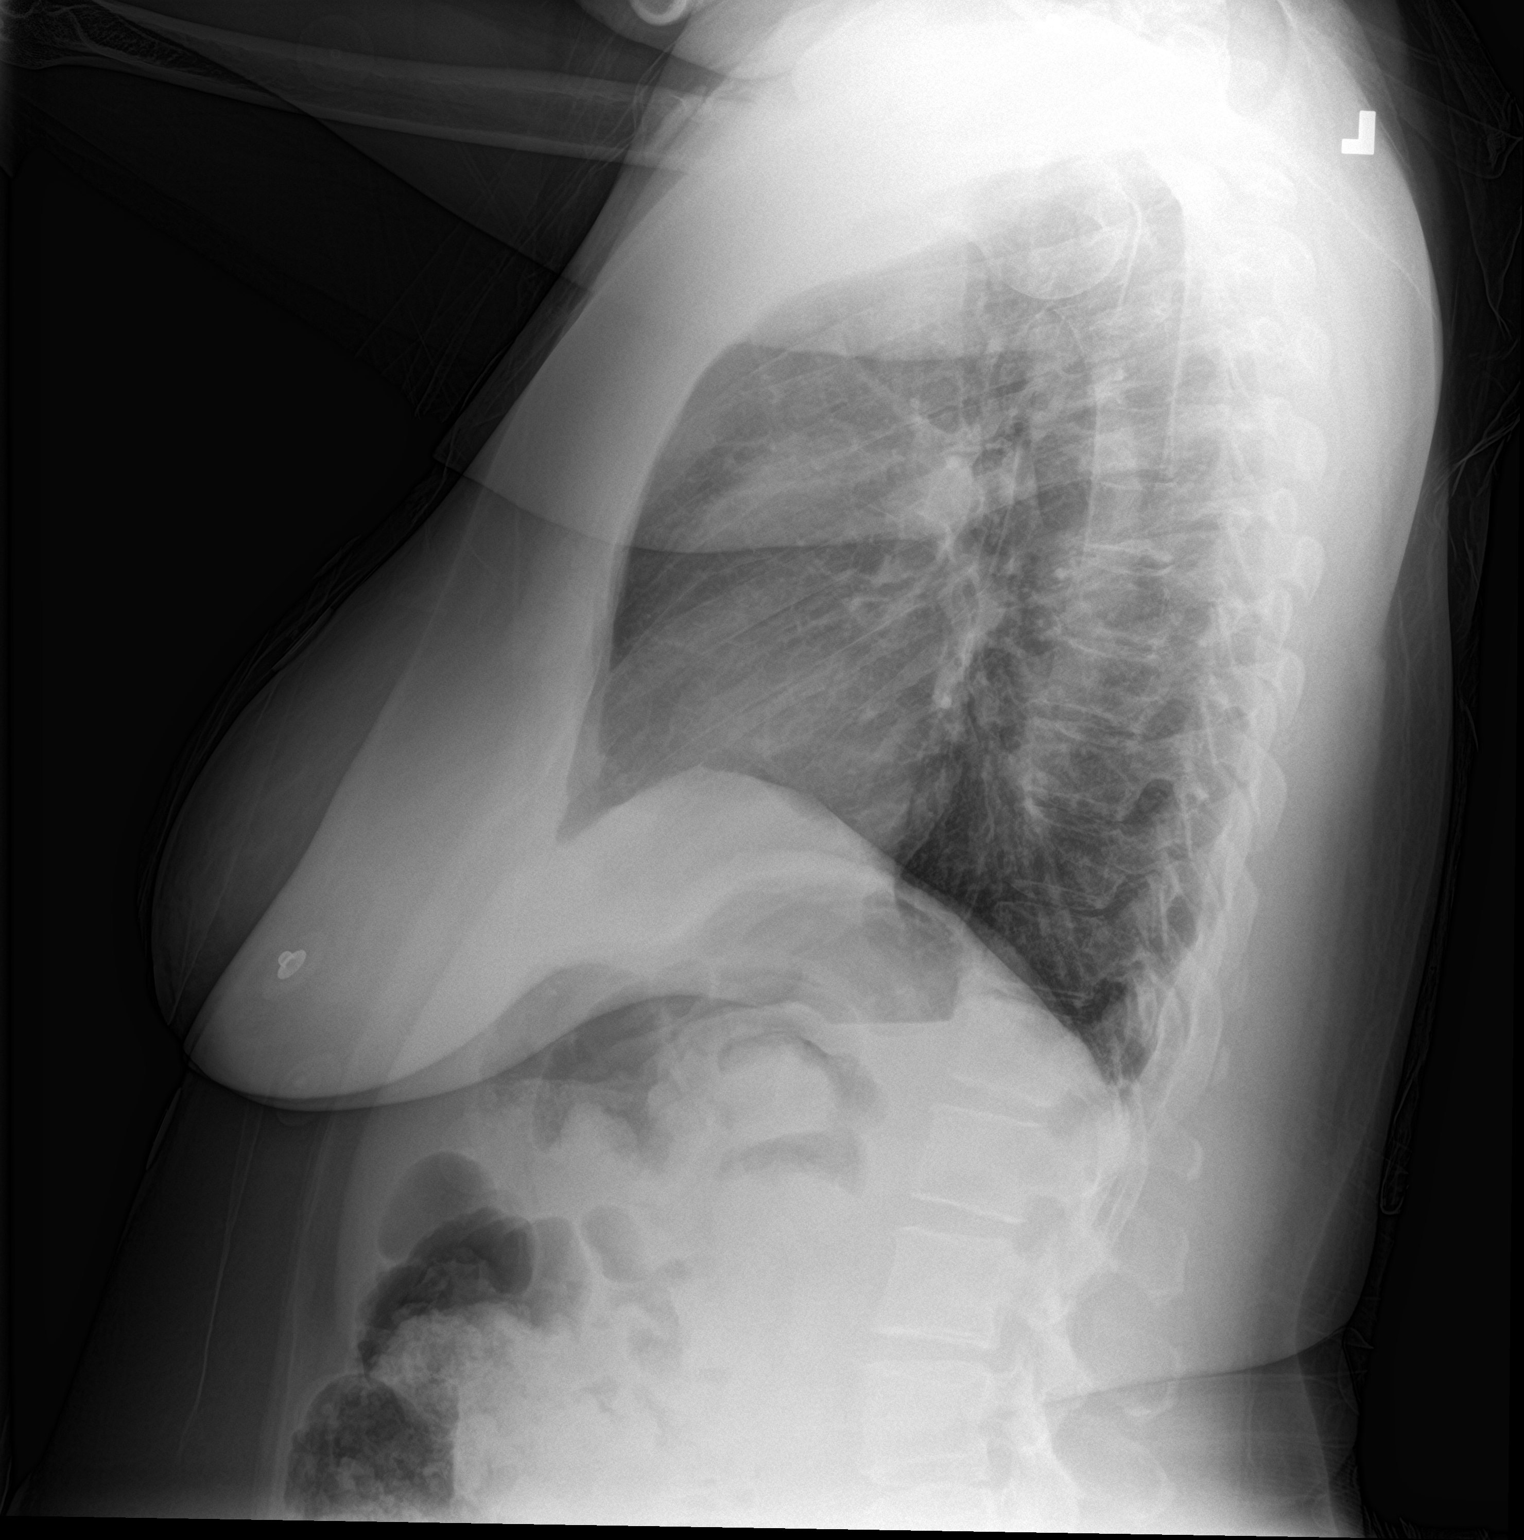

[2 of 2 positions shown; findings below may reference images not displayed]

FINDINGS: Heart and mediastinal contours are within normal limits. No focal
opacities or effusions. No acute bony abnormality.
IMPRESSION: No active cardiopulmonary disease.

## 2021-12-09 ENCOUNTER — Encounter (INDEPENDENT_AMBULATORY_CARE_PROVIDER_SITE_OTHER): Payer: Self-pay

## 2022-01-09 ENCOUNTER — Emergency Department (HOSPITAL_BASED_OUTPATIENT_CLINIC_OR_DEPARTMENT_OTHER)
Admission: EM | Admit: 2022-01-09 | Discharge: 2022-01-09 | Disposition: A | Discharge status: Home / Self Care | Payer: BC Managed Care – PPO | Attending: Emergency Medicine | Admitting: Emergency Medicine

## 2022-01-09 ENCOUNTER — Other Ambulatory Visit: Payer: Self-pay

## 2022-01-09 ENCOUNTER — Encounter (HOSPITAL_BASED_OUTPATIENT_CLINIC_OR_DEPARTMENT_OTHER): Payer: Self-pay | Admitting: Emergency Medicine

## 2022-01-09 ENCOUNTER — Emergency Department (HOSPITAL_BASED_OUTPATIENT_CLINIC_OR_DEPARTMENT_OTHER): Payer: BC Managed Care – PPO

## 2022-01-09 DIAGNOSIS — K529 Noninfective gastroenteritis and colitis, unspecified: Secondary | ICD-10-CM | POA: Diagnosis not present

## 2022-01-09 DIAGNOSIS — Z7984 Long term (current) use of oral hypoglycemic drugs: Secondary | ICD-10-CM | POA: Insufficient documentation

## 2022-01-09 DIAGNOSIS — E119 Type 2 diabetes mellitus without complications: Secondary | ICD-10-CM | POA: Diagnosis not present

## 2022-01-09 DIAGNOSIS — R112 Nausea with vomiting, unspecified: Secondary | ICD-10-CM | POA: Diagnosis present

## 2022-01-09 LAB — URINALYSIS, ROUTINE W REFLEX MICROSCOPIC
Bilirubin Urine: NEGATIVE
Glucose, UA: NEGATIVE mg/dL
Hgb urine dipstick: NEGATIVE
Ketones, ur: NEGATIVE mg/dL
Leukocytes,Ua: NEGATIVE
Nitrite: NEGATIVE
Protein, ur: 30 mg/dL — AB
Specific Gravity, Urine: 1.032 — ABNORMAL HIGH (ref 1.005–1.030)
pH: 6.5 (ref 5.0–8.0)

## 2022-01-09 LAB — CBC WITH DIFFERENTIAL/PLATELET
Abs Immature Granulocytes: 0.01 10*3/uL (ref 0.00–0.07)
Basophils Absolute: 0 10*3/uL (ref 0.0–0.1)
Basophils Relative: 0 %
Eosinophils Absolute: 0 10*3/uL (ref 0.0–0.5)
Eosinophils Relative: 0 %
HCT: 39 % (ref 36.0–46.0)
Hemoglobin: 12.5 g/dL (ref 12.0–15.0)
Immature Granulocytes: 0 %
Lymphocytes Relative: 6 %
Lymphs Abs: 0.5 10*3/uL — ABNORMAL LOW (ref 0.7–4.0)
MCH: 25.8 pg — ABNORMAL LOW (ref 26.0–34.0)
MCHC: 32.1 g/dL (ref 30.0–36.0)
MCV: 80.4 fL (ref 80.0–100.0)
Monocytes Absolute: 0.3 10*3/uL (ref 0.1–1.0)
Monocytes Relative: 3 %
Neutro Abs: 7.5 10*3/uL (ref 1.7–7.7)
Neutrophils Relative %: 91 %
Platelets: 309 10*3/uL (ref 150–400)
RBC: 4.85 MIL/uL (ref 3.87–5.11)
RDW: 14.6 % (ref 11.5–15.5)
WBC: 8.3 10*3/uL (ref 4.0–10.5)
nRBC: 0 % (ref 0.0–0.2)

## 2022-01-09 LAB — COMPREHENSIVE METABOLIC PANEL
ALT: 17 U/L (ref 0–44)
AST: 16 U/L (ref 15–41)
Albumin: 4.4 g/dL (ref 3.5–5.0)
Alkaline Phosphatase: 81 U/L (ref 38–126)
Anion gap: 11 (ref 5–15)
BUN: 15 mg/dL (ref 6–20)
CO2: 27 mmol/L (ref 22–32)
Calcium: 9.4 mg/dL (ref 8.9–10.3)
Chloride: 103 mmol/L (ref 98–111)
Creatinine, Ser: 0.67 mg/dL (ref 0.44–1.00)
GFR, Estimated: >60 mL/min (ref >60)
Glucose, Bld: 87 mg/dL (ref 70–99)
Potassium: 3.6 mmol/L (ref 3.5–5.1)
Sodium: 141 mmol/L (ref 135–145)
Total Bilirubin: 0.4 mg/dL (ref 0.3–1.2)
Total Protein: 8.2 g/dL — ABNORMAL HIGH (ref 6.5–8.1)

## 2022-01-09 LAB — LIPASE, BLOOD: Lipase: 36 U/L (ref 11–51)

## 2022-01-09 MED ORDER — ONDANSETRON 4 MG PO TBDP: 4.0000 mg | ORAL_TABLET | Freq: Three times a day (TID) | ORAL | 0 refills | Status: DC | PRN

## 2022-01-09 MED ORDER — LOPERAMIDE HCL 2 MG PO CAPS: 2.0000 mg | ORAL_CAPSULE | Freq: Four times a day (QID) | ORAL | 0 refills | Status: DC | PRN

## 2022-01-09 MED ORDER — LOPERAMIDE HCL 2 MG PO CAPS
2.0000 mg | ORAL_CAPSULE | Freq: Once | ORAL | Status: AC
  Administered 2022-01-09: 2 mg via ORAL
  Filled 2022-01-09: qty 1

## 2022-01-09 MED ORDER — ONDANSETRON HCL 4 MG/2ML IJ SOLN
4.0000 mg | Freq: Once | INTRAMUSCULAR | Status: AC
  Administered 2022-01-09: 4 mg via INTRAVENOUS
  Filled 2022-01-09: qty 2

## 2022-01-09 MED ORDER — SODIUM CHLORIDE 0.9 % IV BOLUS
1000.0000 mL | Freq: Once | INTRAVENOUS | Status: AC
  Administered 2022-01-09: 1000 mL via INTRAVENOUS

## 2022-01-09 MED ORDER — IOHEXOL 300 MG/ML  SOLN
100.0000 mL | Freq: Once | INTRAMUSCULAR | Status: AC | PRN
  Administered 2022-01-09: 100 mL via INTRAVENOUS

## 2022-01-09 NOTE — ED Triage Notes (Signed)
Cbg 's have been normal to high range, 177 last one.

## 2022-01-09 NOTE — ED Notes (Signed)
Dc instructions reviewed with patient. Patient voiced understanding. Dc with belongings.  °

## 2022-01-09 NOTE — Discharge Instructions (Signed)
We evaluated you in the emergency department for your nausea, vomiting, and diarrhea.  Your symptoms are most likely related to a viral infection.  Your lab tests were reassuring and did not show signs of dehydration.  Your CT scan was negative for any dangerous process such as appendicitis or other infections.  Please take your nausea medicine as prescribed as you need to for nausea and vomiting.  Please also take Imodium, which is a medicine that can help reduce diarrhea.  I have also prescribed you this medicine.  Please try to keep up with your fluid intake.  Please return to the emergency department if you develop any new or worsening symptoms such as severe abdominal pain, bloody diarrhea, inability to eat or drink, or any other concerning symptoms.

## 2022-01-09 NOTE — ED Triage Notes (Signed)
N/v/d 12 hours. Vomited 5-10 times and diarrhea 8 times. No fevers but has chills.

## 2022-01-09 NOTE — ED Provider Notes (Signed)
[facility] Provider Note  CSN: [id number] Arrival date & time: 01/09/22 1439  Chief Complaint(s) Nausea  HPI Leah Gonzalez is a 56 y.o. female with history of diabetes presenting to the emergency department with nausea, vomiting.  She also reports diarrhea.  No blood in her vomit or stool.  She reports chills, no fever.  She also reports mild diffuse abdominal pain.  She reports mild dysuria today.  Denies any recent travel, surgeries, antibiotic use, raw meat, shellfish.  No one at home is sick.  Denies sore throat, runny nose, cough.  Symptoms moderate.   Past Medical History Past Medical History:  Diagnosis Date   Back pain    Diabetes (HCC)    on ozempic and metformin   Dyslipidemia    Dyspnea    Gastritis and duodenitis    GERD (gastroesophageal reflux disease)    Goiter    Hypercholesterolemia    Iron deficiency anemia    Left hip pain    Lower extremity edema    Obesity    Patient Active Problem List   Diagnosis Date Noted   Abnormal histological findings in specimens from other organs, systems and tissues 06/08/2021   Chest pain 06/08/2021   Constipation 06/08/2021   Dyslipidemia 06/08/2021   Edema 06/08/2021   Gastritis and duodenitis 06/08/2021   Gastroesophageal reflux disease 06/08/2021   Goiter 06/08/2021   Iron deficiency anemia 06/08/2021   Primary open-angle glaucoma, bilateral, mild stage 06/08/2021   Pure hypercholesterolemia 06/08/2021   Sciatica 06/08/2021   At risk for hypoglycemia 09/02/2020   Bilateral lower extremity edema 07/28/2020   Diabetes mellitus (HCC) 06/10/2020   Hyperlipidemia associated with type 2 diabetes mellitus (HCC) 06/10/2020   Vitamin D deficiency 06/10/2020   Class 3 severe obesity due to excess calories with serious comorbidity and body mass index (BMI) of 40.0 to 44.9 in adult [facility](HCC) 06/10/2020   Home Medication(s) Prior to Admission medications   Medication Sig Start Date End Date  Taking? Authorizing Provider  loperamide (IMODIUM) 2 MG capsule Take 1 capsule (2 mg total) by mouth 4 (four) times daily as needed for diarrhea or loose stools. 01/09/22  Yes Lonell GrandchildScheving, Leonidas Boateng L, MD  ondansetron (ZOFRAN-ODT) 4 MG disintegrating tablet Take 1 tablet (4 mg total) by mouth every 8 (eight) hours as needed for nausea or vomiting. 01/09/22  Yes Lonell GrandchildScheving, Lameisha Schuenemann L, MD  atorvastatin (LIPITOR) 10 MG tablet Take 1 tablet (10 mg total) by mouth daily. 03/18/21   Whitmire, Thermon Leylandawn W, FNP  dexlansoprazole (DEXILANT) 60 MG capsule Take 60 mg by mouth daily.    [provider]  dicyclomine (BENTYL) 20 MG tablet Take 1 tablet (20 mg total) by mouth 3 (three) times daily as needed for spasms. 08/08/21   Dartha LodgeFord, Kelsey N, PA-C  DULoxetine (CYMBALTA) 30 MG capsule Take 2 capsules (60 mg) daily 06/08/21   Ocie Doynehima, Jennifer, MD  ferrous sulfate 325 (65 FE) MG tablet Take 325 mg by mouth daily. 12/23/20   [provider]  LUMIGAN 0.01 % SOLN 1 drop at bedtime. 05/19/21   [provider]  meclizine (ANTIVERT) 25 MG tablet Take 1 tablet (25 mg total) by mouth 3 (three) times daily as needed for dizziness. 07/03/20   Robinson, [place] N, PA-C  melatonin 3 MG TABS tablet Take 3 mg by mouth at bedtime as needed (For sleep).    [provider]  metFORMIN (GLUCOPHAGE) 500 MG tablet 1 tab by mouth daily with breakfast Patient taking differently: Take  500 mg by mouth daily with breakfast. 03/18/21   Whitmire, Thermon Leyland, FNP  Multiple Vitamin (MULTIVITAMIN WITH MINERALS) TABS tablet Take 1 tablet by mouth daily.    [provider]  naproxen (NAPROSYN) 500 MG tablet Take 500 mg by mouth 2 (two) times daily as needed for mild pain. 05/12/21   [provider]  rizatriptan (MAXALT) 10 MG tablet Take 1 tablet (10 mg total) by mouth as needed for migraine. May repeat in 2 hours if needed Patient taking differently: Take 10 mg by mouth daily as needed for migraine. 05/13/21   Ocie Doyne, MD  Semaglutide,0.25 or 0.5MG /DOS, (OZEMPIC, 0.25 OR 0.5 MG/DOSE,) 2 MG/1.5ML SOPN Inject 0.25 mg into the skin once a week. Patient taking differently: Inject 0.5 mg into the skin once a week. 03/18/21   Whitmire, Thermon Leyland, FNP  topiramate (TOPAMAX) 25 MG tablet Take 100 mg (4 pills) at bedtime 05/05/21   Ocie Doyne, MD  triamterene-hydrochlorothiazide (MAXZIDE-25) 37.5-25 MG tablet Take 1 tablet by mouth daily. 12/06/17   [provider]                                                                                                                                    Past Surgical History Past Surgical History:  Procedure Laterality Date   ABDOMINAL HYSTERECTOMY  10/2009   CESAREAN SECTION     TUBAL LIGATION     Family History Family History  Problem Relation Age of Onset   Asthma Mother    Hypertension Mother    Obesity Mother    Deep vein thrombosis Mother    Hypertension Father    Diabetes Father    Hyperlipidemia Father    Mental illness Sister    Hypertension Brother    Kidney failure Brother    Hypertension Brother    Cancer Maternal Grandmother    Migraines Paternal Aunt     Social History Social History   Tobacco Use   Smoking status: Never   Smokeless tobacco: Never  Substance Use Topics   Alcohol use: Never   Drug use: Never   Allergies Topiramate and Latex  Review of Systems Review of Systems  All other systems reviewed and are negative.   Physical Exam Vital Signs  I have reviewed the triage vital signs BP 114/72   Pulse 86   Temp 99.3 F (37.4 C)   Resp 16   SpO2 100%  Physical Exam Vitals and nursing note reviewed.  Constitutional:      General: She is not in acute distress.    Appearance: She is well-developed.  HENT:     Head: Normocephalic and atraumatic.     Mouth/Throat:     Mouth: Mucous membranes are moist.  Eyes:     Pupils: Pupils are equal, round, and reactive to light.  Cardiovascular:     Rate and  Rhythm: Normal rate and regular rhythm.  Heart sounds: No murmur heard. Pulmonary:     Effort: Pulmonary effort is normal. No respiratory distress.     Breath sounds: Normal breath sounds.  Abdominal:     General: Abdomen is flat. There is no distension.     Palpations: Abdomen is soft.     Tenderness: There is abdominal tenderness (mild diffuse tenderness, worse in RLQ).  Musculoskeletal:        General: No tenderness.     Right lower leg: No edema.     Left lower leg: No edema.  Skin:    General: Skin is warm and dry.  Neurological:     General: No focal deficit present.     Mental Status: She is alert. Mental status is at baseline.  Psychiatric:        Mood and Affect: Mood normal.        Behavior: Behavior normal.     ED Results and Treatments Labs (all labs ordered are listed, but only abnormal results are displayed) Labs Reviewed  COMPREHENSIVE METABOLIC PANEL - Abnormal; Notable for the following components:      Result Value   Total Protein 8.2 (*)    All other components within normal limits  CBC WITH DIFFERENTIAL/PLATELET - Abnormal; Notable for the following components:   MCH 25.8 (*)    Lymphs Abs 0.5 (*)    All other components within normal limits  URINALYSIS, ROUTINE W REFLEX MICROSCOPIC - Abnormal; Notable for the following components:   Specific Gravity, Urine 1.032 (*)    Protein, ur 30 (*)    Bacteria, UA RARE (*)    All other components within normal limits  LIPASE, BLOOD                                                                                                                          Radiology CT Abdomen Pelvis W Contrast  Result Date: 01/09/2022 CLINICAL DATA:  Nausea and vomiting. EXAM: CT ABDOMEN AND PELVIS WITH CONTRAST TECHNIQUE: Multidetector CT imaging of the abdomen and pelvis was performed using the standard protocol following bolus administration of intravenous contrast. RADIATION DOSE REDUCTION: This exam was performed according  to the departmental dose-optimization program which includes automated exposure control, adjustment of the mA and/or kV according to patient size and/or use of iterative reconstruction technique. CONTRAST:  OMNIPAQUE IOHEXOL 300 MG/ML  SOLN COMPARISON:  August 08, 2021 FINDINGS: Lower chest: No acute abnormality. Hepatobiliary: No focal liver abnormality is seen. No gallstones, gallbladder wall thickening, or biliary dilatation. Pancreas: Unremarkable. No pancreatic ductal dilatation or surrounding inflammatory changes. Spleen: Normal in size without focal abnormality. Adrenals/Urinary Tract: Adrenal glands are unremarkable. Kidneys are normal in size, without renal calculi or hydronephrosis. Bilateral subcentimeter renal cysts are noted. Bladder is unremarkable. Stomach/Bowel: Stomach is within normal limits. Appendix appears normal. No evidence of bowel wall thickening, distention, or inflammatory changes. Vascular/Lymphatic: No significant vascular findings are present. No enlarged abdominal or pelvic lymph nodes. Reproductive: Status post  hysterectomy. No adnexal masses. Other: A 1.6 cm x 1.0 cm fat containing right-sided para umbilical hernia is noted. No abdominopelvic ascites. Musculoskeletal: No acute or significant osseous findings. IMPRESSION: 1. No acute or active process within the abdomen or pelvis. 2. Small fat containing right-sided para umbilical hernia. 3. Bilateral subcentimeter renal cysts (Bosniak 1). No additional follow-up imaging is recommended. This recommendation follows ACR consensus guidelines: Management of the Incidental Renal Mass on CT: A White Paper of the ACR Incidental Findings Committee. J Am Coll Radiol [phone]. Electronically Signed   By: Aram Candela M.D.   On: 01/09/2022 17:34    Pertinent labs & imaging results that were available during my care of the patient were reviewed by me and considered in my medical decision making (see MDM for  details).  Medications Ordered in ED Medications  sodium chloride 0.9 % bolus 1,000 mL (1,000 mLs Intravenous New Bag/Given 01/09/22 1619)  ondansetron (ZOFRAN) injection 4 mg (4 mg Intravenous Given 01/09/22 1620)  loperamide (IMODIUM) capsule 2 mg (2 mg Oral Given 01/09/22 1620)  iohexol (OMNIPAQUE) 300 MG/ML solution 100 mL (100 mLs Intravenous Contrast Given 01/09/22 1723)                                                                                                                                     Procedures Procedures  (including critical care time)  Medical Decision Making / ED Course   MDM:  56 year old female presenting to the emergency department with nausea and vomiting.  Patient overall well-appearing, physical exam with mild diffuse abdominal tenderness, worse in the lower abdomen.  Suspect most likely causes viral gastroenteritis given symptoms.  Lower concern for bacterial diarrhea or invasive diarrhea with no blood in the stool.  Patient afebrile in the emergency department.  Given abdominal tenderness, differential also includes an intra-abdominal infection such as appendicitis, diverticulitis, so will obtain CT scan.  Will obtain basic labs including CMP and lipase.  Lower concern for pancreatitis without epigastric pain or focal tenderness in the epigastrium.  We will treat nausea, vomiting with fluids and Zofran, reassess  Clinical Course as of 01/10/22 1158  Sat Jan 09, 2022  1800 CT abdomen without evidence of intracranial process.  Suspect viral gastroenteritis.  Will prescribe Zofran.  Discussed Imodium with the patient. Will discharge patient to home. All questions answered. Patient comfortable with plan of discharge. Return precautions discussed with patient and specified on the after visit summary.  [WS]    Clinical Course User Index [WS] Lonell Grandchild, MD     Additional history obtained: -Additional history obtained from spouse -External records  from outside source obtained and reviewed including: Chart review including previous notes, labs, imaging, consultation notes   Lab Tests: -I ordered, reviewed, and interpreted labs.   The pertinent results include:   Labs Reviewed  COMPREHENSIVE METABOLIC PANEL - Abnormal; Notable for the following components:  Result Value   Total Protein 8.2 (*)    All other components within normal limits  CBC WITH DIFFERENTIAL/PLATELET - Abnormal; Notable for the following components:   MCH 25.8 (*)    Lymphs Abs 0.5 (*)    All other components within normal limits  URINALYSIS, ROUTINE W REFLEX MICROSCOPIC - Abnormal; Notable for the following components:   Specific Gravity, Urine 1.032 (*)    Protein, ur 30 (*)    Bacteria, UA RARE (*)    All other components within normal limits  LIPASE, BLOOD      EKG   EKG Interpretation  Date/Time:    Ventricular Rate:    PR Interval:    QRS Duration:   QT Interval:    QTC Calculation:   R Axis:     Text Interpretation:           Imaging Studies ordered: I ordered imaging studies including CT A/P  On my interpretation imaging demonstrates no acute process I independently visualized and interpreted imaging. I agree with the radiologist interpretation   Medicines ordered and prescription drug management: Meds ordered this encounter  Medications   sodium chloride 0.9 % bolus 1,000 mL   ondansetron (ZOFRAN) injection 4 mg   loperamide (IMODIUM) capsule 2 mg   iohexol (OMNIPAQUE) 300 MG/ML solution 100 mL   ondansetron (ZOFRAN-ODT) 4 MG disintegrating tablet    Sig: Take 1 tablet (4 mg total) by mouth every 8 (eight) hours as needed for nausea or vomiting.    Dispense:  12 tablet    Refill:  0   loperamide (IMODIUM) 2 MG capsule    Sig: Take 1 capsule (2 mg total) by mouth 4 (four) times daily as needed for diarrhea or loose stools.    Dispense:  12 capsule    Refill:  0    -I have reviewed the patients home medicines and have  made adjustments as needed    Reevaluation: After the interventions noted above, I reevaluated the patient and found that they have improved  Co morbidities that complicate the patient evaluation  Past Medical History:  Diagnosis Date   Back pain    Diabetes (HCC)    on ozempic and metformin   Dyslipidemia    Dyspnea    Gastritis and duodenitis    GERD (gastroesophageal reflux disease)    Goiter    Hypercholesterolemia    Iron deficiency anemia    Left hip pain    Lower extremity edema    Obesity       Dispostion: Discharge    Final Clinical Impression(s) / ED Diagnoses Final diagnoses:  Gastroenteritis     This chart was dictated using voice recognition software.  Despite best efforts to proofread,  errors can occur which can change the documentation meaning.    Lonell Grandchild, MD 01/10/22 1158

## 2022-12-19 ENCOUNTER — Encounter (HOSPITAL_COMMUNITY): Payer: Self-pay

## 2022-12-19 ENCOUNTER — Emergency Department (HOSPITAL_COMMUNITY)
Admission: EM | Admit: 2022-12-19 | Discharge: 2022-12-19 | Disposition: A | Discharge status: Home / Self Care | Payer: BC Managed Care – PPO | Attending: Emergency Medicine | Admitting: Emergency Medicine

## 2022-12-19 DIAGNOSIS — Z91040 Latex allergy status: Secondary | ICD-10-CM | POA: Diagnosis not present

## 2022-12-19 DIAGNOSIS — R55 Syncope and collapse: Secondary | ICD-10-CM | POA: Diagnosis present

## 2022-12-19 LAB — URINALYSIS, ROUTINE W REFLEX MICROSCOPIC
Bacteria, UA: NONE SEEN
Bilirubin Urine: NEGATIVE
Glucose, UA: NEGATIVE mg/dL
Hgb urine dipstick: NEGATIVE
Ketones, ur: NEGATIVE mg/dL
Nitrite: NEGATIVE
Protein, ur: NEGATIVE mg/dL
Specific Gravity, Urine: 1.02 (ref 1.005–1.030)
pH: 6 (ref 5.0–8.0)

## 2022-12-19 LAB — CBC
HCT: 36.5 % (ref 36.0–46.0)
Hemoglobin: 11.5 g/dL — ABNORMAL LOW (ref 12.0–15.0)
MCH: 26.1 pg (ref 26.0–34.0)
MCHC: 31.5 g/dL (ref 30.0–36.0)
MCV: 82.8 fL (ref 80.0–100.0)
Platelets: 303 10*3/uL (ref 150–400)
RBC: 4.41 MIL/uL (ref 3.87–5.11)
RDW: 15.3 % (ref 11.5–15.5)
WBC: 10.1 10*3/uL (ref 4.0–10.5)
nRBC: 0 % (ref 0.0–0.2)

## 2022-12-19 LAB — BASIC METABOLIC PANEL
Anion gap: 9 (ref 5–15)
BUN: 19 mg/dL (ref 6–20)
CO2: 26 mmol/L (ref 22–32)
Calcium: 9.3 mg/dL (ref 8.9–10.3)
Chloride: 105 mmol/L (ref 98–111)
Creatinine, Ser: 1 mg/dL (ref 0.44–1.00)
GFR, Estimated: >60 mL/min (ref >60)
Glucose, Bld: 72 mg/dL (ref 70–99)
Potassium: 3.6 mmol/L (ref 3.5–5.1)
Sodium: 140 mmol/L (ref 135–145)

## 2022-12-19 LAB — TROPONIN I (HIGH SENSITIVITY): Troponin I (High Sensitivity): 2 ng/L (ref <18)

## 2022-12-19 LAB — CBG MONITORING, ED: Glucose-Capillary: 62 mg/dL — ABNORMAL LOW (ref 70–99)

## 2022-12-19 MED ORDER — LACTATED RINGERS IV BOLUS
1000.0000 mL | Freq: Once | INTRAVENOUS | Status: AC
  Administered 2022-12-19: 1000 mL via INTRAVENOUS

## 2022-12-19 NOTE — ED Notes (Signed)
Pt educated on DC instructions and verbalizes understanding, pt ambulatory to [facility] with 4 family members all belongings taken with pt

## 2022-12-19 NOTE — ED Triage Notes (Signed)
Pt arrived via EMS, [place] [facility]. Was out at event in the sun, became dizzy, states hx of same when out in the sun for extended periods of time. 90s systolic on EMS arrival.   20G L AC

## 2022-12-19 NOTE — ED Provider Notes (Signed)
[place] EMERGENCY DEPARTMENT AT [facility] Provider Note   CSN: [id number] Arrival date & time: 12/19/22  1726     History  No chief complaint on file.   Leah Gonzalez is a 57 y.o. female.  57 year old female here today after syncopal episode that occurred she was walking a pill at a park today.  Patient says that she had been out in the sun for that, while she was walking up a hill, she can feel lightheaded.  She says that she then sat down, and briefly lost consciousness.  She says that this happened to her previously.  No chest pain.        Home Medications Prior to Admission medications   Medication Sig Start Date End Date Taking? Authorizing Provider  atorvastatin (LIPITOR) 10 MG tablet Take 1 tablet (10 mg total) by mouth daily. 03/18/21   Whitmire, Thermon Leyland, FNP  dexlansoprazole (DEXILANT) 60 MG capsule Take 60 mg by mouth daily.    [provider]  dicyclomine (BENTYL) 20 MG tablet Take 1 tablet (20 mg total) by mouth 3 (three) times daily as needed for spasms. 08/08/21   Dartha Lodge, PA-C  DULoxetine (CYMBALTA) 30 MG capsule Take 2 capsules (60 mg) daily 06/08/21   Ocie Doyne, MD  ferrous sulfate 325 (65 FE) MG tablet Take 325 mg by mouth daily. 12/23/20   [provider]  loperamide (IMODIUM) 2 MG capsule Take 1 capsule (2 mg total) by mouth 4 (four) times daily as needed for diarrhea or loose stools. 01/09/22   Lonell Grandchild, MD  LUMIGAN 0.01 % SOLN 1 drop at bedtime. 05/19/21   [provider]  meclizine (ANTIVERT) 25 MG tablet Take 1 tablet (25 mg total) by mouth 3 (three) times daily as needed for dizziness. 07/03/20   Robinson, [place] N, PA-C  melatonin 3 MG TABS tablet Take 3 mg by mouth at bedtime as needed (For sleep).    [provider]  metFORMIN (GLUCOPHAGE) 500 MG tablet 1 tab by mouth daily with breakfast Patient taking differently: Take 500 mg by mouth daily with breakfast. 03/18/21   Whitmire, Thermon Leyland,  FNP  Multiple Vitamin (MULTIVITAMIN WITH MINERALS) TABS tablet Take 1 tablet by mouth daily.    [provider]  naproxen (NAPROSYN) 500 MG tablet Take 500 mg by mouth 2 (two) times daily as needed for mild pain. 05/12/21   [provider]  ondansetron (ZOFRAN-ODT) 4 MG disintegrating tablet Take 1 tablet (4 mg total) by mouth every 8 (eight) hours as needed for nausea or vomiting. 01/09/22   Lonell Grandchild, MD  rizatriptan (MAXALT) 10 MG tablet Take 1 tablet (10 mg total) by mouth as needed for migraine. May repeat in 2 hours if needed Patient taking differently: Take 10 mg by mouth daily as needed for migraine. 05/13/21   Ocie Doyne, MD  Semaglutide,0.25 or 0.5MG /DOS, (OZEMPIC, 0.25 OR 0.5 MG/DOSE,) 2 MG/1.5ML SOPN Inject 0.25 mg into the skin once a week. Patient taking differently: Inject 0.5 mg into the skin once a week. 03/18/21   Whitmire, Thermon Leyland, FNP  topiramate (TOPAMAX) 25 MG tablet Take 100 mg (4 pills) at bedtime 05/05/21   Ocie Doyne, MD  triamterene-hydrochlorothiazide (MAXZIDE-25) 37.5-25 MG tablet Take 1 tablet by mouth daily. 12/06/17   [provider]      Allergies    Topiramate and Latex    Review of Systems   Review of Systems  Physical Exam Updated Vital Signs  BP 110/73 (BP Location: Right Arm)   Pulse 96   Temp 98.6 F (37 C) (Oral)   Resp 20   Ht 5' (1.524 m)   Wt 93 kg   SpO2 100%   BMI 40.04 kg/m  Physical Exam Vitals reviewed.  Cardiovascular:     Rate and Rhythm: Normal rate and regular rhythm.     Heart sounds: No murmur heard. Neurological:     General: No focal deficit present.     Mental Status: She is alert.     Gait: Gait normal.     ED Results / Procedures / Treatments   Labs (all labs ordered are listed, but only abnormal results are displayed) Labs Reviewed  CBC - Abnormal; Notable for the following components:      Result Value   Hemoglobin 11.5 (*)    All other components within normal limits   URINALYSIS, ROUTINE W REFLEX MICROSCOPIC - Abnormal; Notable for the following components:   APPearance HAZY (*)    Leukocytes,Ua SMALL (*)    All other components within normal limits  CBG MONITORING, ED - Abnormal; Notable for the following components:   Glucose-Capillary 62 (*)    All other components within normal limits  BASIC METABOLIC PANEL  TROPONIN I (HIGH SENSITIVITY)    EKG EKG Interpretation Date/Time:  Sunday December 19 2022 17:46:13 EDT Ventricular Rate:  88 PR Interval:  144 QRS Duration:  91 QT Interval:  339 QTC Calculation: 411 R Axis:   65  Text Interpretation: Sinus rhythm Low voltage, precordial leads Borderline T wave abnormalities No acute changes Confirmed by Anders Simmonds [id number]) on 12/19/2022 5:52:37 PM  Radiology No results found.  Procedures Procedures    Medications Ordered in ED Medications  lactated ringers bolus 1,000 mL (1,000 mLs Intravenous New Bag/Given 12/19/22 1844)    ED Course/ Medical Decision Making/ A&P                                 Medical Decision Making 57 year old female here today after syncopal episode.  Differential diagnoses include vasovagal syncope, cardiogenic syncope, dehydration, anemia.  Plan # patient has a past medical history significant for hyperlipidemia, does take Ozempic.  Denies any significant GI losses.  Was not drinking alcohol today.  Provide some IV fluids.  My dependent review the patient's chest x-ray shows normal rhythm, no ischemia.  Do not often order troponin, however with the patient exerting himself while this happened, will obtain 1.  Reassessment-patient's troponin 2.  Her blood glucose on arrival was 62, she has been eating and drinking without any difficulty in the emergency department.  He has been having juice and crackers.  Believe that her low blood sugar could have played a role.  Will discharge patient, have her follow-up with her PCP.  Amount and/or Complexity of Data  Reviewed Labs: ordered.           Final Clinical Impression(s) / ED Diagnoses Final diagnoses:  Syncope and collapse    Rx / DC Orders ED Discharge Orders     None         Arletha Pili, DO 12/19/22 2025

## 2022-12-19 NOTE — Discharge Instructions (Addendum)
Your labs that were done here were normal aside from having a lower blood sugar.  Make sure that you are getting plenty drink over the next couple of days.  Please follow-up with your primary care doctor within 1 to 2 weeks.

## 2023-03-26 ENCOUNTER — Encounter (HOSPITAL_COMMUNITY): Payer: Self-pay

## 2023-03-26 ENCOUNTER — Emergency Department (HOSPITAL_COMMUNITY): Payer: BC Managed Care – PPO

## 2023-03-26 ENCOUNTER — Other Ambulatory Visit: Payer: Self-pay

## 2023-03-26 ENCOUNTER — Emergency Department (HOSPITAL_COMMUNITY)
Admission: EM | Admit: 2023-03-26 | Discharge: 2023-03-26 | Disposition: A | Discharge status: Home / Self Care | Payer: BC Managed Care – PPO | Attending: Emergency Medicine | Admitting: Emergency Medicine

## 2023-03-26 DIAGNOSIS — R0789 Other chest pain: Secondary | ICD-10-CM | POA: Diagnosis present

## 2023-03-26 DIAGNOSIS — Z7984 Long term (current) use of oral hypoglycemic drugs: Secondary | ICD-10-CM | POA: Diagnosis not present

## 2023-03-26 DIAGNOSIS — Z91040 Latex allergy status: Secondary | ICD-10-CM | POA: Insufficient documentation

## 2023-03-26 DIAGNOSIS — E119 Type 2 diabetes mellitus without complications: Secondary | ICD-10-CM | POA: Insufficient documentation

## 2023-03-26 DIAGNOSIS — J189 Pneumonia, unspecified organism: Secondary | ICD-10-CM | POA: Diagnosis not present

## 2023-03-26 LAB — CBC
HCT: 37.5 % (ref 36.0–46.0)
Hemoglobin: 11.7 g/dL — ABNORMAL LOW (ref 12.0–15.0)
MCH: 26.1 pg (ref 26.0–34.0)
MCHC: 31.2 g/dL (ref 30.0–36.0)
MCV: 83.7 fL (ref 80.0–100.0)
Platelets: 289 10*3/uL (ref 150–400)
RBC: 4.48 MIL/uL (ref 3.87–5.11)
RDW: 14.6 % (ref 11.5–15.5)
WBC: 6.6 10*3/uL (ref 4.0–10.5)
nRBC: 0 % (ref 0.0–0.2)

## 2023-03-26 LAB — BASIC METABOLIC PANEL
Anion gap: 11 (ref 5–15)
BUN: 11 mg/dL (ref 6–20)
CO2: 24 mmol/L (ref 22–32)
Calcium: 9.4 mg/dL (ref 8.9–10.3)
Chloride: 105 mmol/L (ref 98–111)
Creatinine, Ser: 0.83 mg/dL (ref 0.44–1.00)
GFR, Estimated: >60 mL/min (ref >60)
Glucose, Bld: 107 mg/dL — ABNORMAL HIGH (ref 70–99)
Potassium: 3.9 mmol/L (ref 3.5–5.1)
Sodium: 140 mmol/L (ref 135–145)

## 2023-03-26 LAB — TROPONIN I (HIGH SENSITIVITY)
Troponin I (High Sensitivity): 3 ng/L (ref <18)
Troponin I (High Sensitivity): 4 ng/L (ref <18)

## 2023-03-26 MED ORDER — IOHEXOL 350 MG/ML SOLN
75.0000 mL | Freq: Once | INTRAVENOUS | Status: AC | PRN
  Administered 2023-03-26: 75 mL via INTRAVENOUS

## 2023-03-26 MED ORDER — KETOROLAC TROMETHAMINE 15 MG/ML IJ SOLN
15.0000 mg | Freq: Once | INTRAMUSCULAR | Status: AC
  Administered 2023-03-26: 15 mg via INTRAVENOUS
  Filled 2023-03-26: qty 1

## 2023-03-26 MED ORDER — DOXYCYCLINE HYCLATE 100 MG PO CAPS: 100.0000 mg | ORAL_CAPSULE | Freq: Two times a day (BID) | ORAL | 0 refills | Status: DC

## 2023-03-26 MED ORDER — FENTANYL CITRATE PF 50 MCG/ML IJ SOSY
50.0000 ug | PREFILLED_SYRINGE | Freq: Once | INTRAMUSCULAR | Status: AC
  Administered 2023-03-26: 50 ug via INTRAVENOUS
  Filled 2023-03-26: qty 1

## 2023-03-26 MED ORDER — ETODOLAC 400 MG PO TABS: 400.0000 mg | ORAL_TABLET | Freq: Two times a day (BID) | ORAL | 0 refills | Status: DC

## 2023-03-26 MED ORDER — ONDANSETRON HCL 4 MG/2ML IJ SOLN
4.0000 mg | Freq: Once | INTRAMUSCULAR | Status: AC
  Administered 2023-03-26: 4 mg via INTRAVENOUS
  Filled 2023-03-26: qty 2

## 2023-03-26 NOTE — ED Provider Notes (Signed)
[place] EMERGENCY DEPARTMENT AT [facility] Provider Note   CSN: [id number] Arrival date & time: 03/26/23  4132     History  Chief Complaint  Patient presents with   Chest Pain    Leah Gonzalez is a 57 y.o. female.  57 year old female presents today for concern of chest pain that radiates into the back.  She states pain started this morning.  She was already awake when the pain started.  She states in the past she has had chest pain from her acid reflux but that felt different and that was precordial and this is slightly left-sided.  She describes this as sharp and constant.  Worse with palpation.  No recent illness.  No previous heart disease.  No significant family history of heart disease.  No associated shortness of breath, palpitations, or lightheadedness. She denies prior history of DVT or PE, current leg swelling or leg pain, recent long travel, recent surgery.  The history is provided by the patient. No language interpreter was used.       Home Medications Prior to Admission medications   Medication Sig Start Date End Date Taking? Authorizing Provider  atorvastatin (LIPITOR) 10 MG tablet Take 1 tablet (10 mg total) by mouth daily. 03/18/21   Whitmire, Dawn, FNP  dexlansoprazole (DEXILANT) 60 MG capsule Take 60 mg by mouth daily.    [provider]  dicyclomine (BENTYL) 20 MG tablet Take 1 tablet (20 mg total) by mouth 3 (three) times daily as needed for spasms. 08/08/21   Simeon Craft, PA-C  DULoxetine (CYMBALTA) 30 MG capsule Take 2 capsules (60 mg) daily 06/08/21   Ocie Doyne, MD  ferrous sulfate 325 (65 FE) MG tablet Take 325 mg by mouth daily. 12/23/20   [provider]  loperamide (IMODIUM) 2 MG capsule Take 1 capsule (2 mg total) by mouth 4 (four) times daily as needed for diarrhea or loose stools. 01/09/22   Lonell Grandchild, MD  LUMIGAN 0.01 % SOLN 1 drop at bedtime. 05/19/21   [provider]  meclizine (ANTIVERT) 25  MG tablet Take 1 tablet (25 mg total) by mouth 3 (three) times daily as needed for dizziness. 07/03/20   Robinson, [place] N, PA-C  melatonin 3 MG TABS tablet Take 3 mg by mouth at bedtime as needed (For sleep).    [provider]  metFORMIN (GLUCOPHAGE) 500 MG tablet 1 tab by mouth daily with breakfast Patient taking differently: Take 500 mg by mouth daily with breakfast. 03/18/21   Whitmire, Dawn, FNP  Multiple Vitamin (MULTIVITAMIN WITH MINERALS) TABS tablet Take 1 tablet by mouth daily.    [provider]  naproxen (NAPROSYN) 500 MG tablet Take 500 mg by mouth 2 (two) times daily as needed for mild pain. 05/12/21   [provider]  ondansetron (ZOFRAN-ODT) 4 MG disintegrating tablet Take 1 tablet (4 mg total) by mouth every 8 (eight) hours as needed for nausea or vomiting. 01/09/22   Lonell Grandchild, MD  rizatriptan (MAXALT) 10 MG tablet Take 1 tablet (10 mg total) by mouth as needed for migraine. May repeat in 2 hours if needed Patient taking differently: Take 10 mg by mouth daily as needed for migraine. 05/13/21   Ocie Doyne, MD  Semaglutide,0.25 or 0.5MG /DOS, (OZEMPIC, 0.25 OR 0.5 MG/DOSE,) 2 MG/1.5ML SOPN Inject 0.25 mg into the skin once a week. Patient taking differently: Inject 0.5 mg into the skin once a week. 03/18/21   Whitmire, Dawn, FNP  topiramate (TOPAMAX)  25 MG tablet Take 100 mg (4 pills) at bedtime 05/05/21   Ocie Doyne, MD  triamterene-hydrochlorothiazide (MAXZIDE-25) 37.5-25 MG tablet Take 1 tablet by mouth daily. 12/06/17   [provider]      Allergies    Topiramate and Latex    Review of Systems   Review of Systems  Constitutional:  Negative for chills and fever.  Respiratory:  Negative for shortness of breath.   Cardiovascular:  Positive for chest pain. Negative for palpitations and leg swelling.  Gastrointestinal:  Negative for abdominal pain.  Neurological:  Negative for light-headedness.  All other systems reviewed and are  negative.   Physical Exam Updated Vital Signs BP 119/64   Pulse 66   Temp (!) 97.4 F (36.3 C)   Resp 18   Ht 4\' 11"  (1.499 m)   Wt 92.5 kg   SpO2 100%   BMI 41.20 kg/m  Physical Exam Vitals and nursing note reviewed.  Constitutional:      General: She is not in acute distress.    Appearance: Normal appearance. She is not ill-appearing.  HENT:     Head: Normocephalic and atraumatic.     Nose: Nose normal.  Eyes:     Conjunctiva/sclera: Conjunctivae normal.  Cardiovascular:     Rate and Rhythm: Normal rate and regular rhythm.     Heart sounds: Normal heart sounds.  Pulmonary:     Effort: Pulmonary effort is normal. No respiratory distress.     Breath sounds: Normal breath sounds. No wheezing.  Abdominal:     General: There is no distension.     Palpations: Abdomen is soft.     Tenderness: There is no abdominal tenderness. There is no guarding.  Musculoskeletal:        General: No deformity. Normal range of motion.     Right lower leg: No edema.     Left lower leg: No edema.  Skin:    Findings: No rash.  Neurological:     Mental Status: She is alert.     ED Results / Procedures / Treatments   Labs (all labs ordered are listed, but only abnormal results are displayed) Labs Reviewed  BASIC METABOLIC PANEL - Abnormal; Notable for the following components:      Result Value   Glucose, Bld 107 (*)    All other components within normal limits  CBC - Abnormal; Notable for the following components:   Hemoglobin 11.7 (*)    All other components within normal limits  TROPONIN I (HIGH SENSITIVITY)    EKG EKG Interpretation Date/Time:  Saturday March 26 2023 06:55:38 EST Ventricular Rate:  71 PR Interval:  140 QRS Duration:  88 QT Interval:  396 QTC Calculation: 430 R Axis:   22  Text Interpretation: Normal sinus rhythm Confirmed by Gloris Manchester ([id number]) on 03/26/2023 7:08:25 AM  Radiology No results found.  Procedures Procedures    Medications Ordered  in ED Medications  ketorolac (TORADOL) 15 MG/ML injection 15 mg (15 mg Intravenous Given 03/26/23 0731)    ED Course/ Medical Decision Making/ A&P                                 Medical Decision Making Amount and/or Complexity of Data Reviewed Labs: ordered. Radiology: ordered.  Risk Prescription drug management.   Medical Decision Making / ED Course   This patient presents to the ED for concern of chest pain, this involves  an extensive number of treatment options, and is a complaint that carries with it a high risk of complications and morbidity.  The differential diagnosis includes ACS, PE, pneumonia, MSK pain, GERD  MDM: 57 year old female presents today for concern of chest pain.  It is pleuritic and reproducible on exam.  No previous history of CAD.  Hemodynamically stable.  Will obtain ACS workup.  Low risk for PE on Wells criteria.  After shared decision making patient stated she would like to proceed with PE study.  States her mom had history of blood clots.  CBC reveals no leukocytosis.  Mild anemia but no evidence of acute GI bleed.  Hemodynamically stable.  BMP with preserved renal function.  Normal electrolyte.  Troponin negative x 2.  Chest x-ray without acute cardiopulmonary process.  EKG without acute ischemic change.  Low suspicion for ACS.  CT angio study without evidence of PE but does show some findings suggestive of pneumonia.  Will start her on doxycycline but given she is afebrile and without cough low suspicion that this could be a pneumonia.  Will also start on anti-inflammatory in case this is costochondritis.  Will give referral to cardiology.  Patient is agreeable with plan.  Discharged in stable condition.  Lab Tests: -I ordered, reviewed, and interpreted labs.   The pertinent results include:   Labs Reviewed  BASIC METABOLIC PANEL - Abnormal; Notable for the following components:      Result Value   Glucose, Bld 107 (*)    All other components within  normal limits  CBC - Abnormal; Notable for the following components:   Hemoglobin 11.7 (*)    All other components within normal limits  TROPONIN I (HIGH SENSITIVITY)  TROPONIN I (HIGH SENSITIVITY)      EKG  EKG Interpretation Date/Time:  Saturday March 26 2023 06:55:38 EST Ventricular Rate:  71 PR Interval:  140 QRS Duration:  88 QT Interval:  396 QTC Calculation: 430 R Axis:   22  Text Interpretation: Normal sinus rhythm Confirmed by Gloris Manchester ([id number]) on 03/26/2023 7:08:25 AM         Imaging Studies ordered: I ordered imaging studies including chest x-ray, CT angio chest PE study I independently visualized and interpreted imaging. I agree with the radiologist interpretation   Medicines ordered and prescription drug management: Meds ordered this encounter  Medications   ketorolac (TORADOL) 15 MG/ML injection 15 mg   fentaNYL (SUBLIMAZE) injection 50 mcg   ondansetron (ZOFRAN) injection 4 mg   iohexol (OMNIPAQUE) 350 MG/ML injection 75 mL    -I have reviewed the patients home medicines and have made adjustments as needed   Cardiac Monitoring: The patient was maintained on a cardiac monitor.  I personally viewed and interpreted the cardiac monitored which showed an underlying rhythm of: Normal sinus rhythm   Reevaluation: After the interventions noted above, I reevaluated the patient and found that they have :improved  Co morbidities that complicate the patient evaluation  Past Medical History:  Diagnosis Date   Back pain    Diabetes (HCC)    on ozempic and metformin   Dyslipidemia    Dyspnea    Gastritis and duodenitis    GERD (gastroesophageal reflux disease)    Goiter    Hypercholesterolemia    Iron deficiency anemia    Left hip pain    Lower extremity edema    Obesity       Dispostion: Discharged in stable condition.  Return precaution discussed.  Patient voices understanding  and is in agreement with plan.  Final Clinical Impression(s) /  ED Diagnoses Final diagnoses:  Atypical chest pain  Pneumonia due to infectious organism, unspecified laterality, unspecified part of lung    Rx / DC Orders ED Discharge Orders          Ordered    etodolac (LODINE) 400 MG tablet  2 times daily        03/26/23 1249    doxycycline (VIBRAMYCIN) 100 MG capsule  2 times daily        03/26/23 1249    Ambulatory referral to Cardiology       Comments: If you have not heard from the Cardiology office within the next 72 hours please call [phone].   03/26/23 1251              Marita Kansas, PA-C 03/26/23 1251    Gloris Manchester, MD 03/26/23 1623

## 2023-03-26 NOTE — ED Notes (Signed)
Pt ambulated to restroom without incident.

## 2023-03-26 NOTE — Discharge Instructions (Addendum)
CT scan shows some concern for pneumonia.  I have sent antibiotic into the pharmacy for you.  Of also given a cardiology referral.  Follow-up with your primary care provider.  I have sent Lodine which is an anti-inflammatory medication.  Do not combine this with other anti-inflammatory medications such as ibuprofen or Aleve.  If any concerning symptoms return to the emergency room.

## 2023-03-26 NOTE — ED Triage Notes (Signed)
Pt c/o left sided chest pain that radiates into back and down left arm. Pt states she was already awake and then the pain came on all of a sudden. Pt has shortness of breath, denies nausea or vomiting.

## 2023-07-12 ENCOUNTER — Other Ambulatory Visit: Payer: Self-pay | Admitting: Family Medicine

## 2023-07-12 DIAGNOSIS — R911 Solitary pulmonary nodule: Secondary | ICD-10-CM

## 2023-07-18 ENCOUNTER — Ambulatory Visit: Payer: Self-pay | Attending: Cardiology | Admitting: Cardiology

## 2023-07-18 DIAGNOSIS — Z91199 Patient's noncompliance with other medical treatment and regimen due to unspecified reason: Secondary | ICD-10-CM

## 2023-07-18 NOTE — Progress Notes (Unsigned)
 No show

## 2023-07-26 ENCOUNTER — Encounter: Payer: Self-pay | Admitting: Family Medicine

## 2023-07-27 ENCOUNTER — Encounter: Payer: Self-pay | Admitting: Urology

## 2023-07-27 NOTE — Progress Notes (Signed)
 To whom it may concern,  Leah Gonzalez husband underwent surgery on 07/27/23.  Please excuse her from work from 07/27/23 - 07/29/23  to help take care of him while he recovers.   If you have any further questions/concerns, please contact my office.  Sincerely,     Berniece Salines, MD [facility] [phone]

## 2023-07-28 ENCOUNTER — Ambulatory Visit: Attending: Cardiology | Admitting: Cardiology

## 2023-07-28 VITALS — BP 116/72 | HR 66 | Resp 16 | Ht 59.0 in | Wt 206.2 lb

## 2023-07-28 DIAGNOSIS — E66813 Obesity, class 3: Secondary | ICD-10-CM

## 2023-07-28 DIAGNOSIS — E1169 Type 2 diabetes mellitus with other specified complication: Secondary | ICD-10-CM | POA: Diagnosis not present

## 2023-07-28 DIAGNOSIS — E785 Hyperlipidemia, unspecified: Secondary | ICD-10-CM | POA: Diagnosis not present

## 2023-07-28 DIAGNOSIS — R072 Precordial pain: Secondary | ICD-10-CM | POA: Diagnosis not present

## 2023-07-28 DIAGNOSIS — Z6841 Body Mass Index (BMI) 40.0 and over, adult: Secondary | ICD-10-CM

## 2023-07-28 MED ORDER — METOPROLOL TARTRATE 25 MG PO TABS: 12.5000 mg | ORAL_TABLET | Freq: Two times a day (BID) | ORAL | 0 refills | Status: DC

## 2023-07-28 NOTE — Progress Notes (Signed)
 Cardiology Office Note:    NAME:  Leah Gonzalez    MRN: [id number] DOB:  12/06/1965   PCP:  Laurann Montana, MD  Former Cardiology Providers: None Primary Cardiologist:  Tessa Lerner, DO, [facility] (established care 07/28/2023) Electrophysiologist:  None   Referring MD: Laurann Montana, MD  Reason of Consult: Chest Pain  Chief Complaint  Patient presents with   Chest Pain   New Patient (Initial Visit)    History of Present Illness:    Leah Gonzalez is a 58 y.o. African-American female whose past medical history and cardiovascular risk factors includes: HLD, NIDDM Type II, obesity (Body mass index is 41.65 kg/m.). She is being seen today for the evaluation of chest pain at the request of Laurann Montana, MD.  Chest Pain  Last episode 2 months ago  Substernal  Intensity: 7/10 Duration: 60 minutes (then went to ER)  Pressure  Non radiating  Not w/ effort  Improves w/ rest.  A/w: dyspnea  No active chest pain   But continues to have dyspnea w/ exertion.   Exercises daily: walking 15-20 minutes / day.   No family history of premature coronary disease or sudden cardiac death.  Current Medications: Current Meds  Medication Sig   atorvastatin (LIPITOR) 10 MG tablet Take 1 tablet (10 mg total) by mouth daily.   ferrous sulfate 325 (65 FE) MG tablet Take 325 mg by mouth daily.   LUMIGAN 0.01 % SOLN 1 drop at bedtime.   melatonin 3 MG TABS tablet Take 3 mg by mouth at bedtime as needed (For sleep).   metoprolol tartrate (LOPRESSOR) 25 MG tablet Take 0.5 tablets (12.5 mg total) by mouth 2 (two) times daily. For 2 days prior to CT scan and then stop after CT test is complete.   Multiple Vitamin (MULTIVITAMIN WITH MINERALS) TABS tablet Take 1 tablet by mouth daily.   pantoprazole (PROTONIX) 40 MG tablet Take 40 mg by mouth daily.   Semaglutide,0.25 or 0.5MG /DOS, (OZEMPIC, 0.25 OR 0.5 MG/DOSE,) 2 MG/1.5ML SOPN Inject 0.25 mg into the skin once a week. (Patient taking  differently: Inject 0.5 mg into the skin once a week.)     Allergies:    Topiramate and Latex   Past Medical History: Past Medical History:  Diagnosis Date   Back pain    Diabetes (HCC)    on ozempic and metformin   Dyslipidemia    Dyspnea    Gastritis and duodenitis    GERD (gastroesophageal reflux disease)    Goiter    Hypercholesterolemia    Iron deficiency anemia    Left hip pain    Lower extremity edema    Obesity     Past Surgical History: Past Surgical History:  Procedure Laterality Date   ABDOMINAL HYSTERECTOMY  10/2009   CESAREAN SECTION     TUBAL LIGATION      Social History: Social History   Tobacco Use   Smoking status: Never   Smokeless tobacco: Never  Vaping Use   Vaping status: Never Used  Substance Use Topics   Alcohol use: Never   Drug use: Never    Family History: Family History  Problem Relation Age of Onset   Asthma Mother    Hypertension Mother    Obesity Mother    Deep vein thrombosis Mother    Hypertension Father    Diabetes Father    Hyperlipidemia Father    Mental illness Sister    Hypertension Brother    Kidney failure Brother  Hypertension Brother    Cancer Maternal Grandmother    Migraines Paternal Aunt     ROS:   Review of Systems  Cardiovascular:  Positive for chest pain and dyspnea on exertion. Negative for claudication, irregular heartbeat, leg swelling, near-syncope, orthopnea, palpitations, paroxysmal nocturnal dyspnea and syncope.  Respiratory:  Negative for shortness of breath.   Hematologic/Lymphatic: Negative for bleeding problem.    EKGs/Labs/Other Studies Reviewed:   EKG: EKG Interpretation Date/Time:  Thursday July 28 2023 14:20:13 EDT Ventricular Rate:  65 PR Interval:  146 QRS Duration:  90 QT Interval:  386 QTC Calculation: 401 R Axis:   16  Text Interpretation: Normal sinus rhythm with sinus arrhythmia Cannot rule out Anterior infarct , age undetermined When compared with ECG of  26-Mar-2023 06:55, No significant change was found Confirmed by Tessa Lerner [phone]) on 07/28/2023 2:26:59 PM  Echocardiogram: None   Labs:    Latest Ref Rng & Units 03/26/2023    6:53 AM 12/19/2022    6:32 PM 01/09/2022    4:09 PM  CBC  WBC 4.0 - 10.5 K/uL 6.6  10.1  8.3   Hemoglobin 12.0 - 15.0 g/dL 60.4  54.0  98.1   Hematocrit 36.0 - 46.0 % 37.5  36.5  39.0   Platelets 150 - 400 K/uL 289  303  309        Latest Ref Rng & Units 03/26/2023    6:53 AM 12/19/2022    6:32 PM 01/09/2022    4:09 PM  BMP  Glucose 70 - 99 mg/dL 191  72  87   BUN 6 - 20 mg/dL 11  19  15    Creatinine 0.44 - 1.00 mg/dL 4.78  2.95  6.21   Sodium 135 - 145 mmol/L 140  140  141   Potassium 3.5 - 5.1 mmol/L 3.9  3.6  3.6   Chloride 98 - 111 mmol/L 105  105  103   CO2 22 - 32 mmol/L 24  26  27    Calcium 8.9 - 10.3 mg/dL 9.4  9.3  9.4       Latest Ref Rng & Units 03/26/2023    6:53 AM 12/19/2022    6:32 PM 01/09/2022    4:09 PM  CMP  Glucose 70 - 99 mg/dL 308  72  87   BUN 6 - 20 mg/dL 11  19  15    Creatinine 0.44 - 1.00 mg/dL 6.57  8.46  9.62   Sodium 135 - 145 mmol/L 140  140  141   Potassium 3.5 - 5.1 mmol/L 3.9  3.6  3.6   Chloride 98 - 111 mmol/L 105  105  103   CO2 22 - 32 mmol/L 24  26  27    Calcium 8.9 - 10.3 mg/dL 9.4  9.3  9.4   Total Protein 6.5 - 8.1 g/dL   8.2   Total Bilirubin 0.3 - 1.2 mg/dL   0.4   Alkaline Phos 38 - 126 U/L   81   AST 15 - 41 U/L   16   ALT 0 - 44 U/L   17     Lab Results  Component Value Date   CHOL 212 (H) 12/25/2020   HDL 42 12/25/2020   LDLCALC 152 (H) 12/25/2020   TRIG 98 12/25/2020   CHOLHDL 3.7 06/23/2020   No results for input(s): "LIPOA" in the last 8760 hours. No components found for: "NTPROBNP" No results for input(s): "PROBNP" in the last 8760 hours. No results  for input(s): "TSH" in the last 8760 hours.  Physical Exam:    Today's Vitals   07/28/23 1415  BP: 116/72  Pulse: 66  Resp: 16  SpO2: 98%  Weight: 206 lb 3.2 oz (93.5 kg)   Height: 4\' 11"  (1.499 m)   Body mass index is 41.65 kg/m. Wt Readings from Last 3 Encounters:  07/28/23 206 lb 3.2 oz (93.5 kg)  03/26/23 204 lb (92.5 kg)  12/19/22 205 lb (93 kg)    Physical Exam  Constitutional:  Age appropriate, hemodynamically stable, no acute distress.   Neck: No JVD present.  Cardiovascular: Normal rate, regular rhythm, S1 normal and S2 normal. Exam reveals no gallop and no friction rub.  No murmur heard. Pulmonary/Chest: Breath sounds normal. She has no wheezes. She has no rales. She exhibits no tenderness.  Abdominal: Soft. Bowel sounds are normal. She exhibits no distension. There is no abdominal tenderness.  Musculoskeletal:        General: No tenderness or edema.  Neurological: She is alert and oriented to person, place, and time.  Skin: Skin is warm and dry.     Impression & Recommendation(s):  Impression:   ICD-10-CM   1. Precordial pain  R07.2 EKG 12-Lead    ECHOCARDIOGRAM COMPLETE    Basic metabolic panel with GFR    Basic metabolic panel with GFR    CT CORONARY MORPH W/CTA COR W/SCORE W/CA W/CM &/OR WO/CM    2. Type 2 diabetes mellitus with other specified complication, without long-term current use of insulin (HCC)  E11.69     3. Hyperlipidemia associated with type 2 diabetes mellitus (HCC)  E11.69    E78.5     4. Class 3 severe obesity due to excess calories with serious comorbidity and body mass index (BMI) of 40.0 to 44.9 in adult [facility])  W09.811    E66.01    Z68.41        Recommendation(s):  Precordial pain Precordial symptoms suggestive of both cardiac and noncardiac features. Last episode 2 months ago. Has gone to the ER in November 2024 results reviewed. EKG is nonischemic. Risk factors: Diabetes, hyperlipidemia, obesity Echo will be ordered to evaluate for structural heart disease and left ventricular systolic function. Coronary CTA to evaluate for plaque burden, coronary calcium score, and obstructive disease. Lopressor  12.5 mg p.o. twice daily to start 2 days before the scan. Check BMP  Type 2 diabetes mellitus with other specified complication, without long-term current use of insulin (HCC) Reemphasized importance of glycemic control. Currently on Ozempic If needed recommend low-dose ACE inhibitor's or ARB for renal protection.  Hyperlipidemia associated with type 2 diabetes mellitus (HCC) Currently on atorvastatin 10 mg p.o. nightly. Reemphasized importance of lipid management. For now recommend a goal LDL <70 mg/dL given her diabetes  Class 3 severe obesity due to excess calories with serious comorbidity and body mass index (BMI) of 40.0 to 44.9 in adult [facility]) Body mass index is 41.65 kg/m. I reviewed with her importance of diet, regular physical activity/exercise, weight loss.   Patient is educated on the importance of increasing physical activity gradually as tolerated with a goal of moderate intensity exercise for 30 minutes a day 5 days a week.  Orders Placed:  Orders Placed This Encounter  Procedures   CT CORONARY MORPH W/CTA COR W/SCORE W/CA W/CM &/OR WO/CM    Standing Status:   Future    Expected Date:   07/28/2023    Expiration Date:   07/27/2024    If  indicated for the ordered procedure, I authorize the administration of contrast media per Radiology protocol:   Yes    Initiate Coronary CTA Adult Protocol:   Yes    If indicated initiate Post Coronary CTA Hypotension Adult Protocol:   Yes    Does the patient have a contrast media/X-ray dye allergy?:   No    Is patient pregnant?:   No    Preferred Imaging Location?:   [facility]    Authorization::   FFR will be ordered if deemed medically necessary   Basic metabolic panel with GFR    Standing Status:   Future    Number of Occurrences:   1    Expected Date:   07/28/2023    Expiration Date:   07/27/2024   EKG 12-Lead   ECHOCARDIOGRAM COMPLETE    Standing Status:   Future    Expected Date:   08/04/2023    Expiration Date:    07/27/2024    Where should this test be performed:   [facility])    Does the patient weigh less than or greater than 250 lbs?:   Patient weighs less than 250 lbs    Perflutren DEFINITY (image enhancing agent) should be administered unless hypersensitivity or allergy exist:   Administer Perflutren    Reason for exam-Echo:   Other-Full Diagnosis List    Full ICD-10/Reason for Exam:   Precordial pain [786.51.ICD-9-CM]     Final Medication List:    Meds ordered this encounter  Medications   metoprolol tartrate (LOPRESSOR) 25 MG tablet    Sig: Take 0.5 tablets (12.5 mg total) by mouth 2 (two) times daily. For 2 days prior to CT scan and then stop after CT test is complete.    Dispense:  3 tablet    Refill:  0    Medications Discontinued During This Encounter  Medication Reason   dexlansoprazole (DEXILANT) 60 MG capsule Patient Preference   dicyclomine (BENTYL) 20 MG tablet Patient Preference   doxycycline (VIBRAMYCIN) 100 MG capsule Patient Preference   DULoxetine (CYMBALTA) 30 MG capsule Patient Preference   etodolac (LODINE) 400 MG tablet Patient Preference   loperamide (IMODIUM) 2 MG capsule Patient Preference   meclizine (ANTIVERT) 25 MG tablet Patient Preference   metFORMIN (GLUCOPHAGE) 500 MG tablet Patient Preference   naproxen (NAPROSYN) 500 MG tablet Patient Preference   ondansetron (ZOFRAN-ODT) 4 MG disintegrating tablet Patient Preference   rizatriptan (MAXALT) 10 MG tablet Patient Preference   topiramate (TOPAMAX) 25 MG tablet Patient Preference   triamterene-hydrochlorothiazide (MAXZIDE-25) 37.5-25 MG tablet Patient Preference     Current Outpatient Medications:    atorvastatin (LIPITOR) 10 MG tablet, Take 1 tablet (10 mg total) by mouth daily., Disp: 30 tablet, Rfl: 0   ferrous sulfate 325 (65 FE) MG tablet, Take 325 mg by mouth daily., Disp: , Rfl:    LUMIGAN 0.01 % SOLN, 1 drop at bedtime., Disp: , Rfl:    melatonin 3 MG TABS tablet, Take 3 mg  by mouth at bedtime as needed (For sleep)., Disp: , Rfl:    metoprolol tartrate (LOPRESSOR) 25 MG tablet, Take 0.5 tablets (12.5 mg total) by mouth 2 (two) times daily. For 2 days prior to CT scan and then stop after CT test is complete., Disp: 3 tablet, Rfl: 0   Multiple Vitamin (MULTIVITAMIN WITH MINERALS) TABS tablet, Take 1 tablet by mouth daily., Disp: , Rfl:    pantoprazole (PROTONIX) 40 MG tablet, Take 40 mg by mouth  daily., Disp: , Rfl:    Semaglutide,0.25 or 0.5MG /DOS, (OZEMPIC, 0.25 OR 0.5 MG/DOSE,) 2 MG/1.5ML SOPN, Inject 0.25 mg into the skin once a week. (Patient taking differently: Inject 0.5 mg into the skin once a week.), Disp: 1.5 mL, Rfl: 0  Consent:   NA  Disposition:   39-month follow-up sooner if needed.  Patient may be asked to follow-up sooner based on the results of the above-mentioned testing.  Her questions and concerns were addressed to her satisfaction. She voices understanding of the recommendations provided during this encounter.    Signed, Tessa Lerner, DO, [facility] [place]  [facility]  [address] [place], [place] [address]

## 2023-07-28 NOTE — Patient Instructions (Addendum)
 Medication Instructions:  Your physician has recommended you make the following change in your medication:   START Metoprolol Tartrate (Lopressor) 12.5 mg twice daily starting 2 days prior to coronary CTA scan. STOP once the scan is complete.  HOLD if systolic blood pressure (top number) is less than 100 and/ or heart rate is less than 55.   *If you need a refill on your cardiac medications before your next appointment, please call your pharmacy*  Lab Work: To be completed today: BMP  If you have labs (blood work) drawn today and your tests are completely normal, you will receive your results only by: MyChart Message (if you have MyChart) OR A paper copy in the mail If you have any lab test that is abnormal or we need to change your treatment, we will call you to review the results.  Testing/Procedures: Your physician has requested that you have an echocardiogram. Echocardiography is a painless test that uses sound waves to create images of your heart. It provides your doctor with information about the size and shape of your heart and how well your heart's chambers and valves are working. This procedure takes approximately one hour. There are no restrictions for this procedure. Please do NOT wear cologne, perfume, aftershave, or lotions (deodorant is allowed). Please arrive 15 minutes prior to your appointment time.  Please note: We ask at that you not bring children with you during ultrasound (echo/ vascular) testing. Due to room size and safety concerns, children are not allowed in the ultrasound rooms during exams. Our front office staff cannot provide observation of children in our lobby area while testing is being conducted. An adult accompanying a patient to their appointment will only be allowed in the ultrasound room at the discretion of the ultrasound technician under special circumstances. We apologize for any inconvenience.   Follow-Up: At [facility], you and your health needs  are our priority.  As part of our continuing mission to provide you with exceptional heart care, we have created designated Provider Care Teams.  These Care Teams include your primary Cardiologist (physician) and Advanced Practice Providers (APPs -  Physician Assistants and Nurse Practitioners) who all work together to provide you with the care you need, when you need it.  We recommend signing up for the patient portal called "MyChart".  Sign up information is provided on this After Visit Summary.  MyChart is used to connect with patients for Virtual Visits (Telemedicine).  Patients are able to view lab/test results, encounter notes, upcoming appointments, etc.  Non-urgent messages can be sent to your provider as well.   To learn more about what you can do with MyChart, go to [email or web address].    Your next appointment:   3 month(s)  The format for your next appointment:   In Person  Provider:   Tessa Lerner, DO {  Other Instructions   Your cardiac CT will be scheduled at one of the below locations:   [facility] [address] [place], [place] [address] [phone]   If scheduled at [facility], please arrive at the [facility] and Children's Entrance (Entrance C2) of [facility] 30 minutes prior to test start time. You can use the FREE valet parking offered at entrance C (encouraged to control the heart rate for the test)  Proceed to the [facility] Radiology Department (first floor) to check-in and test prep.  All radiology patients and guests should use entrance C2 at [facility], accessed from [place]  [employer], even though the hospital's physical address listed is [address]      Please follow these instructions carefully (unless otherwise directed):   On the Night Before the Test: Be sure to Drink plenty of water. Do not consume any caffeinated/decaffeinated beverages or chocolate 12 hours prior to your test. Do  not take any antihistamines 12 hours prior to your test.   On the Day of the Test: Drink plenty of water until 1 hour prior to the test. Do not eat any food 1 hour prior to test. You may take your regular medications prior to the test.  Take metoprolol (Lopressor) 12.5 mg two hours prior to test. FEMALES- please wear underwire-free bra if available, avoid dresses & tight clothing  After the Test: Drink plenty of water. After receiving IV contrast, you may experience a mild flushed feeling. This is normal. On occasion, you may experience a mild rash up to 24 hours after the test. This is not dangerous. If this occurs, you can take Benadryl 25 mg, Zyrtec, Claritin, or Allegra and increase your fluid intake. (Patients taking Tikosyn should avoid Benadryl, and may take Zyrtec, Claritin, or Allegra) If you experience trouble breathing, this can be serious. If it is severe call 911 IMMEDIATELY. If it is mild, please call our office.  We will call to schedule your test 2-4 weeks out understanding that some insurance companies will need an authorization prior to the service being performed.   For more information and frequently asked questions, please visit our website : [email or web address]  For non-scheduling related questions, please contact the cardiac imaging nurse navigator should you have any questions/concerns: Cardiac Imaging Nurse Navigators Direct Office Dial: [phone]   For scheduling needs, including cancellations and rescheduling, please call [place], [phone].

## 2023-08-01 LAB — BASIC METABOLIC PANEL WITH GFR: EGFR: 102

## 2023-08-03 ENCOUNTER — Other Ambulatory Visit: Payer: Self-pay

## 2023-08-06 ENCOUNTER — Encounter: Payer: Self-pay | Admitting: Cardiology

## 2023-08-06 NOTE — Progress Notes (Signed)
 External Labs: Collected: August 01, 2023 provided by primary team BUN 16, creatinine 0.68 Sodium 142, potassium 4.4, chloride 106, bicarb 31  Renal function stable for coronary CTA scheduled later this month in April 2025  Outside labs provided by your primary care provider reviewed.  Regards,   Baelyn Doring [place], DO, [facility]

## 2023-08-07 ENCOUNTER — Encounter: Payer: Self-pay | Admitting: Cardiology

## 2023-08-11 ENCOUNTER — Encounter (HOSPITAL_COMMUNITY): Payer: Self-pay

## 2023-08-12 ENCOUNTER — Telehealth (HOSPITAL_COMMUNITY): Payer: Self-pay | Admitting: *Deleted

## 2023-08-12 NOTE — Telephone Encounter (Signed)
 Attempted to call patient regarding upcoming cardiac CT appointment. Left message on voicemail with name and callback number  Larey Brick RN Navigator Cardiac Imaging [facility] [phone] Office [phone] Cell

## 2023-08-15 ENCOUNTER — Ambulatory Visit (HOSPITAL_COMMUNITY)
Admission: RE | Admit: 2023-08-15 | Discharge: 2023-08-15 | Disposition: A | Discharge status: Home / Self Care | Source: Ambulatory Visit | Attending: Cardiology | Admitting: Cardiology

## 2023-08-15 DIAGNOSIS — R072 Precordial pain: Secondary | ICD-10-CM | POA: Diagnosis not present

## 2023-08-15 MED ORDER — NITROGLYCERIN 0.4 MG SL SUBL
SUBLINGUAL_TABLET | SUBLINGUAL | Status: AC
  Filled 2023-08-15: qty 2

## 2023-08-15 MED ORDER — NITROGLYCERIN 0.4 MG SL SUBL
0.8000 mg | SUBLINGUAL_TABLET | Freq: Once | SUBLINGUAL | Status: AC
  Administered 2023-08-15: 0.8 mg via SUBLINGUAL

## 2023-08-15 MED ORDER — IOHEXOL 350 MG/ML SOLN
95.0000 mL | Freq: Once | INTRAVENOUS | Status: AC | PRN
  Administered 2023-08-15: 95 mL via INTRAVENOUS

## 2023-08-18 ENCOUNTER — Other Ambulatory Visit: Payer: Self-pay | Admitting: Student

## 2023-08-18 DIAGNOSIS — R109 Unspecified abdominal pain: Secondary | ICD-10-CM

## 2023-08-20 ENCOUNTER — Encounter: Payer: Self-pay | Admitting: Cardiology

## 2023-08-25 ENCOUNTER — Ambulatory Visit (HOSPITAL_COMMUNITY): Attending: Internal Medicine

## 2023-08-25 DIAGNOSIS — R072 Precordial pain: Secondary | ICD-10-CM | POA: Insufficient documentation

## 2023-08-25 LAB — ECHOCARDIOGRAM COMPLETE
Area-P 1/2: 3.81 cm2
S' Lateral: 2.4 cm

## 2023-08-26 ENCOUNTER — Ambulatory Visit
Admission: RE | Admit: 2023-08-26 | Discharge: 2023-08-26 | Disposition: A | Discharge status: Home / Self Care | Source: Ambulatory Visit | Attending: Student | Admitting: Student

## 2023-08-26 DIAGNOSIS — R109 Unspecified abdominal pain: Secondary | ICD-10-CM

## 2023-10-24 ENCOUNTER — Ambulatory Visit: Attending: Cardiology | Admitting: Cardiology

## 2023-10-24 ENCOUNTER — Encounter: Payer: Self-pay | Admitting: Cardiology

## 2023-10-24 VITALS — BP 116/80 | HR 68 | Resp 16 | Ht 59.0 in | Wt 206.4 lb

## 2023-10-24 DIAGNOSIS — E1169 Type 2 diabetes mellitus with other specified complication: Secondary | ICD-10-CM | POA: Diagnosis not present

## 2023-10-24 DIAGNOSIS — E66813 Obesity, class 3: Secondary | ICD-10-CM

## 2023-10-24 DIAGNOSIS — R072 Precordial pain: Secondary | ICD-10-CM

## 2023-10-24 DIAGNOSIS — Z6841 Body Mass Index (BMI) 40.0 and over, adult: Secondary | ICD-10-CM

## 2023-10-24 DIAGNOSIS — E785 Hyperlipidemia, unspecified: Secondary | ICD-10-CM | POA: Diagnosis not present

## 2023-10-24 NOTE — Patient Instructions (Signed)
 Medication Instructions:  No medication changes were made at this visit. Continue current regimen.   *If you need a refill on your cardiac medications before your next appointment, please call your pharmacy*  Lab Work: None ordered today. If you have labs (blood work) drawn today and your tests are completely normal, you will receive your results only by: MyChart Message (if you have MyChart) OR A paper copy in the mail If you have any lab test that is abnormal or we need to change your treatment, we will call you to review the results.  Testing/Procedures: None ordered today.  Follow-Up: At [facility], you and your health needs are our priority.  As part of our continuing mission to provide you with exceptional heart care, our providers are all part of one team.  This team includes your primary Cardiologist (physician) and Advanced Practice Providers or APPs (Physician Assistants and Nurse Practitioners) who all work together to provide you with the care you need, when you need it.  Your next appointment:   As needed  Provider:   Olinda Bertrand, DO

## 2023-10-24 NOTE — Progress Notes (Signed)
 Cardiology Office Note:    NAME:  Leah Gonzalez    MRN: [id number] DOB:  [birth year]   PCP:  Teresa Channel, MD  Former Cardiology Providers: None Primary Cardiologist:  Madonna Large, DO, [facility] (established care 07/28/2023) Electrophysiologist:  None    Chief Complaint  Patient presents with   Follow-up    Re-evaluation of chest pain and review test results    History of Present Illness:    Leah Gonzalez is a 58 y.o. African-American female whose past medical history and cardiovascular risk factors includes: HLD, NIDDM Type II, obesity (Body mass index is 41.69 kg/m.). She is being seen today for the evaluation of chest pain at the request of Teresa Channel, MD.  Patient was seen in the office back in March 2025 as a consult for precordial pain.  Her symptoms have both cardiac and noncardiac features.  She had been to the ER for similar discomfort in November 2024.  And given her risk factors shared decision was to proceed with further workup.  She underwent echo and coronary CTA results reviewed with her in detail during today's visit and mentioned below for further reference.  Since last office visit patient denies any anginal chest pain or heart failure symptoms.  No hospitalizations or urgent care visits for cardiovascular reasons.  She has been compliant with her medical therapy.  Denies any reoccurrence of chest pain.  Continues to exercise daily by walking 20 minutes at least per day.    No family history of premature coronary disease or sudden cardiac death.  Current Medications: Current Meds  Medication Sig   atorvastatin  (LIPITOR) 10 MG tablet Take 1 tablet (10 mg total) by mouth daily.   ferrous sulfate 325 (65 FE) MG tablet Take 325 mg by mouth daily.   melatonin 3 MG TABS tablet Take 3 mg by mouth at bedtime as needed (For sleep).   Multiple Vitamin (MULTIVITAMIN WITH MINERALS) TABS tablet Take 1 tablet by mouth daily.   pantoprazole (PROTONIX) 40 MG tablet Take  40 mg by mouth daily.   Semaglutide ,0.25 or 0.5MG /DOS, (OZEMPIC , 0.25 OR 0.5 MG/DOSE,) 2 MG/1.5ML SOPN Inject 0.25 mg into the skin once a week. (Patient taking differently: Inject 0.5 mg into the skin once a week.)     Allergies:    Topiramate  and Latex   Past Medical History: Past Medical History:  Diagnosis Date   Back pain    Diabetes (HCC)    on ozempic  and metformin    Dyslipidemia    Dyspnea    Gastritis and duodenitis    GERD (gastroesophageal reflux disease)    Goiter    Hypercholesterolemia    Iron deficiency anemia    Left hip pain    Lower extremity edema    Obesity     Past Surgical History: Past Surgical History:  Procedure Laterality Date   ABDOMINAL HYSTERECTOMY  10/2009   CESAREAN SECTION     TUBAL LIGATION      Social History: Social History   Tobacco Use   Smoking status: Never   Smokeless tobacco: Never  Vaping Use   Vaping status: Never Used  Substance Use Topics   Alcohol use: Never   Drug use: Never    Family History: Family History  Problem Relation Age of Onset   Asthma Mother    Hypertension Mother    Obesity Mother    Deep vein thrombosis Mother    Hypertension Father    Diabetes Father    Hyperlipidemia  Father    Mental illness Sister    Hypertension Brother    Kidney failure Brother    Hypertension Brother    Cancer Maternal Grandmother    Migraines Paternal Aunt     ROS:   Review of Systems  Cardiovascular:  Negative for chest pain, claudication, irregular heartbeat, leg swelling, near-syncope, orthopnea, palpitations, paroxysmal nocturnal dyspnea and syncope.  Respiratory:  Negative for shortness of breath.   Hematologic/Lymphatic: Negative for bleeding problem.    EKGs/Labs/Other Studies Reviewed:   Echocardiogram: 08/25/2023  1. Left ventricular ejection fraction, by estimation, is 60 to 65%. Left ventricular ejection fraction by 3D volume is 65 %. The left ventricle has normal function. The left ventricle has  no regional wall motion abnormalities. Left ventricular diastolic  parameters were normal.  2. Right ventricular systolic function is normal. The right ventricular size is normal. There is normal pulmonary artery systolic pressure. The estimated right ventricular systolic pressure is 24.5 mmHg.  3. The mitral valve is normal in structure. Trivial mitral valve regurgitation. No evidence of mitral stenosis.  4. The aortic valve is tricuspid. There is mild thickening of the aortic valve. Aortic valve regurgitation is trivial. No aortic stenosis is present.  5. The inferior vena cava is normal in size with greater than 50% respiratory variability, suggesting right atrial pressure of 3 mmHg.  CCTA April 2025 1.  Calcium  score 0   2.  Normal ascending thoracic aorta 3.0 cm   3.  Normal right dominant coronary arteries   4.  Total plaque volume also 0 mm3  5.  Radiology Overread: 1. No acute or unexpected extracardiac findings. 2. The previous 5 mm right middle lobe nodule is not seen on the current exam and has likely resolved.  Labs: External Labs: Collected: August 01, 2023 provided by primary team BUN 16, creatinine 0.68 Sodium 142, potassium 4.4, chloride 106, bicarb 31  Physical Exam:    Today's Vitals   10/24/23 1054  BP: 116/80  Pulse: 68  Resp: 16  SpO2: 98%  Weight: 206 lb 6.4 oz (93.6 kg)  Height: 4' 11 (1.499 m)   Body mass index is 41.69 kg/m. Wt Readings from Last 3 Encounters:  10/24/23 206 lb 6.4 oz (93.6 kg)  07/28/23 206 lb 3.2 oz (93.5 kg)  03/26/23 204 lb (92.5 kg)    Physical Exam  Constitutional:  Age appropriate, hemodynamically stable, no acute distress.   Neck: No JVD present.  Cardiovascular: Normal rate, regular rhythm, S1 normal and S2 normal. Exam reveals no gallop and no friction rub.  No murmur heard. Pulmonary/Chest: Breath sounds normal. She has no wheezes. She has no rales. She exhibits no tenderness.  Abdominal: Soft. Bowel sounds are  normal. She exhibits no distension. There is no abdominal tenderness.  Musculoskeletal:        General: No tenderness or edema.  Neurological: She is alert and oriented to person, place, and time.  Skin: Skin is warm and dry.   Impression & Recommendation(s):  Impression:   ICD-10-CM   1. Precordial pain  R07.2     2. Type 2 diabetes mellitus with other specified complication, without long-term current use of insulin  (HCC)  E11.69     3. Hyperlipidemia associated with type 2 diabetes mellitus (HCC)  E11.69    E78.5     4. Class 3 severe obesity due to excess calories with serious comorbidity and body mass index (BMI) of 40.0 to 44.9 in adult  E66.813    Z68.41  Recommendation(s):  Precordial pain No reoccurrence of chest pain since last office visit. There were symptoms were suggestive of both cardiac and noncardiac features patient underwent workup due to her risk factors and prior ER visits for similar discomfort. Echocardiogram: Preserved LVEF and no significant valvular heart disease Coronary CTA April 2025: Coronary calcium  score is 0 total plaque volume is 0. Reemphasized importance of primary prevention. Prior EKG is nonischemic. Risk factors: Diabetes, hyperlipidemia, obesity  Type 2 diabetes mellitus with other specified complication, without long-term current use of insulin  (HCC) Reemphasized importance of glycemic control. Currently on Ozempic  If needed recommend low-dose ACE inhibitor's or ARB for renal protection.  Hyperlipidemia associated with type 2 diabetes mellitus (HCC) Currently on atorvastatin  10 mg p.o. nightly. Reemphasized importance of lipid management. For now recommend a goal LDL <70 mg/dL given her diabetes  Class 3 severe obesity due to excess calories with serious comorbidity and body mass index (BMI) of 40.0 to 44.9 in adult [facility]) Body mass index is 41.69 kg/m. I reviewed with her importance of diet, regular physical  activity/exercise, weight loss.   Patient is educated on the importance of increasing physical activity gradually as tolerated with a goal of moderate intensity exercise for 30 minutes a day 5 days a week.   Orders Placed:  No orders of the defined types were placed in this encounter.  Medical decision making: Complexity low. Here for reevaluation of chest pain. Reemphasize importance of improving modifiable cardiovascular risk factors Independently reviewed the results of echocardiogram and coronary CTA.  Questions and concerns were addressed to her satisfaction Follow-up on as-needed basis going forward. Patient is agreeable with the plan of care.  Final Medication List:    No orders of the defined types were placed in this encounter.   Medications Discontinued During This Encounter  Medication Reason   metoprolol  tartrate (LOPRESSOR ) 25 MG tablet No longer needed (for PRN medications)   LUMIGAN 0.01 % SOLN Patient Preference     Current Outpatient Medications:    atorvastatin  (LIPITOR) 10 MG tablet, Take 1 tablet (10 mg total) by mouth daily., Disp: 30 tablet, Rfl: 0   ferrous sulfate 325 (65 FE) MG tablet, Take 325 mg by mouth daily., Disp: , Rfl:    melatonin 3 MG TABS tablet, Take 3 mg by mouth at bedtime as needed (For sleep)., Disp: , Rfl:    Multiple Vitamin (MULTIVITAMIN WITH MINERALS) TABS tablet, Take 1 tablet by mouth daily., Disp: , Rfl:    pantoprazole (PROTONIX) 40 MG tablet, Take 40 mg by mouth daily., Disp: , Rfl:    Semaglutide ,0.25 or 0.5MG /DOS, (OZEMPIC , 0.25 OR 0.5 MG/DOSE,) 2 MG/1.5ML SOPN, Inject 0.25 mg into the skin once a week. (Patient taking differently: Inject 0.5 mg into the skin once a week.), Disp: 1.5 mL, Rfl: 0  Consent:   NA  Disposition:   As needed  Patient may be asked to follow-up sooner based on the results of the above-mentioned testing.  Her questions and concerns were addressed to her satisfaction. She voices understanding of the  recommendations provided during this encounter.    Signed, Madonna Michele HAS, [facility]  A Division of [facility] [address] [place], [place] [address]  [place], [place] [address] Pager: [phone] Office: [phone] 12:40 PM  10/24/23

## 2024-01-14 ENCOUNTER — Other Ambulatory Visit: Payer: Self-pay

## 2024-01-14 ENCOUNTER — Emergency Department (HOSPITAL_BASED_OUTPATIENT_CLINIC_OR_DEPARTMENT_OTHER)
Admission: EM | Admit: 2024-01-14 | Discharge: 2024-01-14 | Disposition: A | Discharge status: Home / Self Care | Attending: Emergency Medicine | Admitting: Emergency Medicine

## 2024-01-14 ENCOUNTER — Emergency Department (HOSPITAL_BASED_OUTPATIENT_CLINIC_OR_DEPARTMENT_OTHER)

## 2024-01-14 ENCOUNTER — Encounter (HOSPITAL_BASED_OUTPATIENT_CLINIC_OR_DEPARTMENT_OTHER): Payer: Self-pay

## 2024-01-14 DIAGNOSIS — Z91040 Latex allergy status: Secondary | ICD-10-CM | POA: Insufficient documentation

## 2024-01-14 DIAGNOSIS — M25561 Pain in right knee: Secondary | ICD-10-CM | POA: Insufficient documentation

## 2024-01-14 NOTE — ED Triage Notes (Signed)
 She c/o being awakened from sleep d/t right post. Knee pain two days ago. She saw her [facility] pcp who recommended doppler study. She denies fever, nor any other sign of current illness. CMS intact bilat. Feet and all bilat. Toes.

## 2024-01-14 NOTE — Discharge Instructions (Signed)
 You have been seen and discharged from the emergency department.  The ultrasound done here showed no blood clot/DVT.  Follow-up with your primary provider for further evaluation and further care. Take home medications as prescribed. If you have any worsening symptoms or further concerns for your health please return to an emergency department for further evaluation.

## 2024-01-14 NOTE — ED Provider Notes (Signed)
 Leah Gonzalez EMERGENCY DEPARTMENT AT [facility] Provider Note   CSN: [id number] Arrival date & time: 01/14/24  9161     Patient presents with: Knee Pain   Leah Gonzalez is a 58 y.o. female.   HPI   58 year old female presents emergency department with right posterior knee pain.  About 2 days ago she developed right posterior knee pain with mild swelling.  Was seen by her primary doctor who referred her here for Doppler study to rule out DVT.  Couple weeks ago she did drive back from [place]  but otherwise denies any history or risk factors for DVT.  No numbness or otherwise discoloration of the leg.  She is been ambulatory with mild pain.  Prior to Admission medications   Medication Sig Start Date End Date Taking? Authorizing Provider  atorvastatin  (LIPITOR) 10 MG tablet Take 1 tablet (10 mg total) by mouth daily. 03/18/21   Whitmire, Dawn, FNP  ferrous sulfate 325 (65 FE) MG tablet Take 325 mg by mouth daily. 12/23/20   [provider]  melatonin 3 MG TABS tablet Take 3 mg by mouth at bedtime as needed (For sleep).    [provider]  Multiple Vitamin (MULTIVITAMIN WITH MINERALS) TABS tablet Take 1 tablet by mouth daily.    [provider]  pantoprazole (PROTONIX) 40 MG tablet Take 40 mg by mouth daily. 07/23/23   [provider]  Semaglutide ,0.25 or 0.5MG /DOS, (OZEMPIC , 0.25 OR 0.5 MG/DOSE,) 2 MG/1.5ML SOPN Inject 0.25 mg into the skin once a week. Patient taking differently: Inject 0.5 mg into the skin once a week. 03/18/21   Whitmire, Dawn, FNP    Allergies: Topiramate  and Latex    Review of Systems  Constitutional:  Negative for fever.  Respiratory:  Negative for shortness of breath.   Cardiovascular:  Negative for chest pain.  Musculoskeletal:        + right knee pain  Skin:  Negative for color change and rash.  Neurological:  Negative for weakness and numbness.    Updated Vital Signs BP 114/84 (BP Location: Right Arm)    Pulse 75   Temp 98 F (36.7 C) (Oral)   Resp 16   SpO2 98%   Physical Exam Vitals and nursing note reviewed.  Constitutional:      General: She is not in acute distress.    Appearance: Normal appearance.  HENT:     Head: Normocephalic.     Mouth/Throat:     Mouth: Mucous membranes are moist.  Cardiovascular:     Rate and Rhythm: Normal rate.  Pulmonary:     Effort: Pulmonary effort is normal. No respiratory distress.  Musculoskeletal:     Comments: Mild tenderness to palpation of the right popliteal fossa with no significant edema noted compared to the left.  No patellar pain, knee is stable, no deformity. Right calf is soft, equal palpable DP pulses, neuro intact  Skin:    General: Skin is warm.  Neurological:     Mental Status: She is alert and oriented to person, place, and time. Mental status is at baseline.  Psychiatric:        Mood and Affect: Mood normal.     (all labs ordered are listed, but only abnormal results are displayed) Labs Reviewed - No data to display  EKG: None  Radiology: No results found.   Procedures   Medications Ordered in the ED - No data to display  Medical Decision Making  58 year old female presents emergency department atraumatic posterior right knee pain.  Did recently drive up from [place] , sent here from the primary doctor's office to rule out DVT.  Otherwise there is been no injury to the knee, deformity.  She is able to weight-bear as tolerated.  Pain is primarily in the right popliteal fossa, no focal abnormality noted, knee is otherwise unremarkable.  Low suspicion for septic joint.  DVT ultrasound is negative.  Will plan for outpatient follow-up and treatment.  Patient at this time appears safe and stable for discharge and close outpatient follow up. Discharge plan and strict return to ED precautions discussed, patient verbalizes understanding and agreement.     Final diagnoses:  None     ED Discharge Orders     None          Bari Roxie HERO, DO 01/14/24 1332

## 2024-01-14 NOTE — ED Notes (Signed)
 Reviewed AVS/discharge instruction with patient. Time allotted for and all questions answered. Patient is agreeable for d/c and escorted to ed exit by staff.
